# Patient Record
Sex: Male | Born: 1940 | Race: White | Hispanic: No | Smoking: Current some day smoker
Health system: Southern US, Community
[De-identification: ages and names within clinical notes are randomized; demographics above are authoritative.]

## PROBLEM LIST (undated history)

## (undated) DIAGNOSIS — I10 Essential (primary) hypertension: Secondary | ICD-10-CM

## (undated) DIAGNOSIS — M545 Low back pain, unspecified: Secondary | ICD-10-CM

## (undated) DIAGNOSIS — J449 Chronic obstructive pulmonary disease, unspecified: Secondary | ICD-10-CM

## (undated) DIAGNOSIS — R0789 Other chest pain: Secondary | ICD-10-CM

## (undated) DIAGNOSIS — M109 Gout, unspecified: Secondary | ICD-10-CM

## (undated) DIAGNOSIS — E7800 Pure hypercholesterolemia, unspecified: Secondary | ICD-10-CM

## (undated) DIAGNOSIS — N200 Calculus of kidney: Secondary | ICD-10-CM

## (undated) DIAGNOSIS — E785 Hyperlipidemia, unspecified: Secondary | ICD-10-CM

## (undated) DIAGNOSIS — R200 Anesthesia of skin: Secondary | ICD-10-CM

## (undated) DIAGNOSIS — R42 Dizziness and giddiness: Secondary | ICD-10-CM

## (undated) DIAGNOSIS — N400 Enlarged prostate without lower urinary tract symptoms: Secondary | ICD-10-CM

## (undated) DIAGNOSIS — L989 Disorder of the skin and subcutaneous tissue, unspecified: Secondary | ICD-10-CM

## (undated) HISTORY — DX: Low back pain, unspecified: M54.50

## (undated) HISTORY — DX: Calculus of kidney: N20.0

## (undated) HISTORY — PX: TRANSURETHRAL RESECTION OF PROSTATE: SHX73

## (undated) HISTORY — DX: Benign prostatic hyperplasia without lower urinary tract symptoms: N40.0

## (undated) HISTORY — DX: Pure hypercholesterolemia, unspecified: E78.00

## (undated) HISTORY — DX: Chronic obstructive pulmonary disease, unspecified: J44.9

## (undated) HISTORY — PX: TONSILECTOMY, ADENOIDECTOMY, BILATERAL MYRINGOTOMY AND TUBES: SHX2538

## (undated) HISTORY — DX: Anesthesia of skin: R20.0

## (undated) HISTORY — DX: Disorder of the skin and subcutaneous tissue, unspecified: L98.9

## (undated) HISTORY — DX: Essential (primary) hypertension: I10

## (undated) HISTORY — DX: Hyperlipidemia, unspecified: E78.5

## (undated) HISTORY — DX: Dizziness and giddiness: R42

## (undated) HISTORY — DX: Other chest pain: R07.89

---
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
## 2000-12-22 ENCOUNTER — Ambulatory Visit (HOSPITAL_COMMUNITY): Admission: RE | Admit: 2000-12-22 | Discharge: 2000-12-22 | Payer: Self-pay | Admitting: Cardiology

## 2000-12-22 ENCOUNTER — Encounter: Payer: Self-pay | Admitting: Cardiology

## 2005-02-21 ENCOUNTER — Emergency Department (HOSPITAL_COMMUNITY): Admission: EM | Admit: 2005-02-21 | Discharge: 2005-02-21 | Payer: Self-pay | Admitting: Family Medicine

## 2005-05-20 ENCOUNTER — Encounter (INDEPENDENT_AMBULATORY_CARE_PROVIDER_SITE_OTHER): Payer: Self-pay | Admitting: Specialist

## 2005-05-21 ENCOUNTER — Inpatient Hospital Stay (HOSPITAL_COMMUNITY): Admission: RE | Admit: 2005-05-21 | Discharge: 2005-05-22 | Payer: Self-pay | Admitting: Urology

## 2005-10-05 ENCOUNTER — Ambulatory Visit (HOSPITAL_COMMUNITY): Admission: RE | Admit: 2005-10-05 | Discharge: 2005-10-05 | Payer: Self-pay | Admitting: Pulmonary Disease

## 2008-09-03 ENCOUNTER — Ambulatory Visit: Payer: Self-pay | Admitting: Internal Medicine

## 2008-09-03 ENCOUNTER — Ambulatory Visit (HOSPITAL_COMMUNITY): Admission: RE | Admit: 2008-09-03 | Discharge: 2008-09-03 | Payer: Self-pay | Admitting: Internal Medicine

## 2008-09-03 ENCOUNTER — Encounter: Payer: Self-pay | Admitting: Internal Medicine

## 2008-09-10 ENCOUNTER — Encounter (INDEPENDENT_AMBULATORY_CARE_PROVIDER_SITE_OTHER): Payer: Self-pay

## 2008-09-11 ENCOUNTER — Encounter: Payer: Self-pay | Admitting: Internal Medicine

## 2010-11-24 NOTE — Op Note (Signed)
NAME:  Samuel Pope, Samuel Pope                 ACCOUNT NO.:  [id number]   MEDICAL RECORD NO.:  [id number]          PATIENT TYPE:  AMB   LOCATION:  DAY                           FACILITY:  [facility]   PHYSICIAN:  R. Roetta Sessions, M.D. DATE OF BIRTH:  20-Jan-1941   DATE OF PROCEDURE:  09/03/2008  DATE OF DISCHARGE:                               OPERATIVE REPORT   PROCEDURE:  Colonoscopy with biopsy and snare polypectomy.   INDICATIONS FOR PROCEDURE:  A 70 year old gentleman here for colorectal  cancer screening.  He had a colonoscopy 10 years ago which was  reportedly negative.  There was no family history of colon polyps or  colon cancer.  Again, he has no lower GI tract symptoms.  Colonoscopy is  now being done as a screening maneuver.  Risks, benefits, alternatives  and limitations have been discussed, he is agreeable.   PROCEDURE NOTE:  The O2 saturation, blood pressure, pulse and  respirations were monitored throughout the entire procedure.   CONSCIOUS SEDATION:  1. Versed 6 mg IV.  2. Demerol 75 mg IV in divided doses.  3. Cetacaine spray for topical oropharyngeal anesthesia.   INSTRUMENT:  Pentax video chip system.   FINDINGS:  Digital rectal exam revealed no abnormalities.   ENDOSCOPIC FINDINGS:  The prep was adequate.  Colon:  Colonic mucosa was  surveyed from the rectosigmoid junction through the left transverse,  right colon to the appendiceal orifice, ileocecal valve and cecum.  These structures were well seen and photographed for the record.  From  this level, the scope was slowly and cautiously withdrawn.  All  previously mentioned mucosal surfaces were again seen.  The patient had  a diminutive polyp at the base of the cecum which was cold  biopsied/removed.  The patient also had left-sided diverticula.  At 35  cm from the anal verge, the patient had an angry pedunculated 1-cm polyp  on a long stalk, please see photos.  This was resected cleanly with one  pass of hot snare  cautery, the polyp was recovered.  The remainder of  colonic mucosa appeared unremarkable.  The scope was pulled down in the  rectum.  A thorough examination of the rectal mucosa, including  retroflexion of the anal verge, demonstrated no abnormalities.  The  patient tolerated the procedure well, was reactive to endoscopy.   IMPRESSION:  1. Normal rectum.  2  Left-sided diverticula sigmoid polyp (35 cm), status post hot snare  polypectomy; diminutive cecal polyp, status post cold biopsy removal.  The remainder of colonic mucosa appeared unremarkable.   RECOMMENDATIONS:  1. Diverticulosis and polyp literature provided to Mr. Mcbreen.  2. No aspirin or arthritis medications for the next 5 days.  3. Follow-up on path.  4. Further recommendations to follow.      Jonathon Bellows, M.D.  Electronically Signed     RMR/MEDQ  D:  09/03/2008  T:  09/03/2008  Job:  [id number]   cc:   Ramon Dredge L. Juanetta Gosling, M.D.  Fax: [phone]

## 2010-11-27 NOTE — Cardiovascular Report (Signed)
[facility]  Patient:    Samuel Pope, Samuel Pope                        MRN: [id number] Proc. Date: 12/22/00 Adm. Date:  [id number] Attending:  Ronaldo Miyamoto CC:         Thomas C. Daleen Squibb, M.D. [facility]  Kari Baars, M.D.  Cardiac Catheterization Lab   Cardiac Catheterization  INDICATIONS:  Mr. Grahn is a 70 year old who has had some atypical chest pain. He had a modestly abnormal exercise stress test and was seen by Dr. Daleen Squibb and referred for cardiac catheterization.  PROCEDURE: 1. Left heart catheterization. 2. Selective coronary arteriography. 3. Selective left ventriculography.  DESCRIPTION OF PROCEDURE:  The procedure was performed from the right femoral artery using 6-French catheters.  He tolerated the procedure without complication.  HEMODYNAMIC DATA: 1. Central aortic pressure 130/74, mean 97. 2. Left ventricular pressure 119/6/13. 3. No gradient on pullback across the aortic valve.  ANGIOGRAPHIC DATA:  The patient was given intracoronary nitroglycerin. 1. The left main coronary artery was free of critical disease. 2. The left anterior descending artery demonstrates some mild focal narrowing    of about 20-30% in the proximal vessel prior to the bifurcation of the    diagonal and the LAD.  The LAD has mild minimal luminal irregularities    noted in the distal vessel but no high grade areas of stenosis. 3. There is a ramus intermedius vessel that has some mild irregularity at the    ostium.  This does not appear to be high grade, however. 4. The AV circumflex provides two marginal branches and is free of critical    disease. 5. The right coronary artery is a large dominant vessel.  No high grade areas    of stenosis were noted.  It provided a posterior descending and    posterolateral branch. 6. Ventriculography was performed in the RAO projection.  No segmental    abnormality of contraction were identified.  Ejection fraction was  61.5%.  CONCLUSIONS: 1. Normal left ventricular function. 2. No critical coronary artery narrowing.  DISPOSITION: 1. Follow up with Dr. Daleen Squibb and Dr. Juanetta Gosling. 2. Strongly advise that the patient discontinue smoking.  ADDENDUM:  There may be perhaps up to 30% narrowing involving the first diagonal branch proximally.  This is difficult to grade but does not appear to be high grade. DD:  12/22/00 TD:  12/22/00 Job: [id number]

## 2010-11-27 NOTE — Op Note (Signed)
NAME:  Samuel Pope, Samuel Pope                 ACCOUNT NO.:  [id number]   MEDICAL RECORD NO.:  [id number]          PATIENT TYPE:  AMB   LOCATION:  DAY                          FACILITY:  [facility]   PHYSICIAN:  Excell Seltzer. Annabell Howells, M.D.    DATE OF BIRTH:  04-Feb-1941   DATE OF PROCEDURE:  05/20/2005  DATE OF DISCHARGE:                                 OPERATIVE REPORT   PROCEDURE:  Cystolithalopaxy of a large bladder stone and TURP.   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   POSTOPERATIVE DIAGNOSIS:  Large bladder stone and benign prostatic  hypertrophy with outlet obstruction.   SURGEON:  Dr. Bjorn Pippin   ANESTHESIA:  General.   SPECIMEN:  Prostate chips and stone fragments.   DRAIN:  22-French three-way Foley catheter.   BLOOD LOSS:  Approximately 1000 mL.   COMPLICATIONS:  None.   INDICATIONS:  Samuel Pope is a 70 year old white male with bladder outlet  obstruction and a large bladder stone, who presents for treatment.   FINDINGS AT PROCEDURE:  He was given p.o. Cipro.  He was taken to the  operating room where a general anesthetic was induced.  He was placed in  lithotomy position.  His perineum and genitalia were prepped with Betadine  solution.  He was draped in the usual sterile fashion.  His urethra was  calibrated to 32-French with Sissy Hoff sounds, and a 28-French continuous  flow resectoscope sheath was inserted without difficulty.  This was  initially fitted with an extension bridge with a 12-degree lens on a single  bridge.  Inspection revealed bilobar hyperplasia with obstruction and an  approximately 3 cm bladder stone at the base of the bladder.  The ureteral  orifices were unremarkable.  There was a large diverticulum on the right  posterior wall of the bladder.  No other abnormalities were noted other than  some mild mucosal irritation from the stone.   The cystoscope was then fitted with a 1000 micron laser fiber  which was  secured to the lens with a Steri-Strip to provide stability.  The laser was  initially set on 0.5 watts, and treatment was initiated.  We eventually had  to increase the power to 0.8 and increase the rate as well, but the stone  initially was fragmented into sufficiently small pieces that it could be  removed.  Approximately 30 minutes laser treatment was required.   Once the stone had been completely fragmented, it was evacuated with a  Careers information officer.   The cystoscope was then removed, and an Iglesias resectoscope with a 12-  degrees lens and 26 loop was then placed.  A resection of prostate was then  initiated.   The bladder neck fibers were resected from 5 to 7 o'clock, and the floor of  the prostate was resected out to alongside the verumontanum.  The right lobe  of the prostate was taken down from bladder neck to apex, out to the  capsular fibers.  Hemostasis was achieved as the resection progressed.   The left lobe was  then taken down in a similar fashion.  Once adequate  resection of the lateral lobes was felt to have been performed, the bladder  was evacuated free of chips.  I then resected some residual apical tissue on  both sides, some additional material from the floor, and alongside the veru  and the anterior aspect of the prostatic urethra was section as well.  This  tissue was then removed, and hemostasis was secured.  Final inspection of  the bladder revealed no retained prostate chips.  There was some stone dust  that was adherent to the mucosa that was not readily removed, but no big  chunks were noted to remain.  The ureteral orifices were unremarkable.  No  fragments of stone or prostate tissue were noted in the diverticulum.  Upon  removal of the scope, an excellent string was noted.   A 22-French three-way Foley catheter was then inserted.  The bladder was  irrigated with an __________ syringe until clear.  Once cleared, the patient  was  placed on continuous irrigation of glycine.  The Foley balloon was  filled with 30 mL of sterile fluid.   At this point, the patient was taken down from the lithotomy position.  His  anesthetic was reversed.  He was moved to the recovery room in stable  condition, and there were no complications.      Excell Seltzer. Annabell Howells, M.D.  Electronically Signed     JJW/MEDQ  D:  05/20/2005  T:  05/20/2005  Job:  [id number]   cc:   Ramon Dredge L. Juanetta Gosling, M.D.  Fax: [phone]

## 2011-01-25 ENCOUNTER — Other Ambulatory Visit (HOSPITAL_COMMUNITY): Payer: Self-pay | Admitting: Pulmonary Disease

## 2011-01-25 ENCOUNTER — Ambulatory Visit (HOSPITAL_COMMUNITY)
Admission: RE | Admit: 2011-01-25 | Discharge: 2011-01-25 | Disposition: A | Discharge status: Home / Self Care | Payer: Medicare HMO | Source: Ambulatory Visit | Attending: Pulmonary Disease | Admitting: Pulmonary Disease

## 2011-01-25 DIAGNOSIS — R079 Chest pain, unspecified: Secondary | ICD-10-CM | POA: Insufficient documentation

## 2011-01-25 DIAGNOSIS — R52 Pain, unspecified: Secondary | ICD-10-CM

## 2011-04-06 ENCOUNTER — Encounter: Payer: Self-pay | Admitting: Pulmonary Disease

## 2015-07-02 DIAGNOSIS — J449 Chronic obstructive pulmonary disease, unspecified: Secondary | ICD-10-CM | POA: Diagnosis not present

## 2015-07-02 DIAGNOSIS — M545 Low back pain: Secondary | ICD-10-CM | POA: Diagnosis not present

## 2015-07-08 DIAGNOSIS — Z0000 Encounter for general adult medical examination without abnormal findings: Secondary | ICD-10-CM | POA: Diagnosis not present

## 2015-07-08 DIAGNOSIS — J449 Chronic obstructive pulmonary disease, unspecified: Secondary | ICD-10-CM | POA: Diagnosis not present

## 2015-07-08 DIAGNOSIS — E785 Hyperlipidemia, unspecified: Secondary | ICD-10-CM | POA: Diagnosis not present

## 2015-07-08 DIAGNOSIS — M545 Low back pain: Secondary | ICD-10-CM | POA: Diagnosis not present

## 2015-07-08 DIAGNOSIS — Z125 Encounter for screening for malignant neoplasm of prostate: Secondary | ICD-10-CM | POA: Diagnosis not present

## 2015-09-30 ENCOUNTER — Ambulatory Visit (INDEPENDENT_AMBULATORY_CARE_PROVIDER_SITE_OTHER): Payer: Medicare PPO | Admitting: Nurse Practitioner

## 2015-09-30 ENCOUNTER — Encounter: Payer: Self-pay | Admitting: Nurse Practitioner

## 2015-09-30 ENCOUNTER — Other Ambulatory Visit: Payer: Self-pay

## 2015-09-30 VITALS — BP 160/110 | HR 68 | Temp 98.0°F | Ht 70.0 in | Wt 195.2 lb

## 2015-09-30 DIAGNOSIS — Z86010 Personal history of colonic polyps: Secondary | ICD-10-CM

## 2015-09-30 MED ORDER — PEG 3350-KCL-NA BICARB-NACL 420 G PO SOLR: 4000.0000 mL | Freq: Once | ORAL | Status: DC

## 2015-09-30 NOTE — Assessment & Plan Note (Signed)
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 He is slightly overdue for his repeat exam. Generally asymptomatic from a GI standpoint. His blood pressure is a little elevated today and he admits he only takes his blood pressure medicine once a day rather than twice a day due to side effects. Encouraged him to contact his primary care to discuss in order to titrate meds and get blood pressure under better control. He states he will do this. At this point we'll proceed with recommended surveillance colonoscopy. Return for follow-up as needed for symptoms.  Proceed with TCS with Dr. Gala Romney in near future: the risks, benefits, and alternatives have been discussed with the patient in detail. The patient states understanding and desires to proceed.  The patient is not on any anticoagulants, anxiolytics, chronic pain medications, or antidepressants. Limited alcohol intake, denies drug use. Conscious sedation should be adequate for his procedure as it was last time.

## 2015-09-30 NOTE — Patient Instructions (Signed)
1. We will schedule your procedure for you. 2. Return for follow-up as needed for any stomach or colon problems, or as recommended after your procedure.

## 2015-09-30 NOTE — Progress Notes (Signed)
Primary Care Physician:  Alonza Bogus, MD Primary Gastroenterologist:  Dr. Gala Romney  Chief Complaint  Patient presents with  . Colonoscopy    HPI:   Samuel Pope is a 75 y.o. male who presents To schedule 5 year colonoscopy. Last colonoscopy completed to 09/03/2008 which was a routine 10 year exam. Findings included normal rectum, left-sided diverticula, sigmoid polyp, diminutive cecal polyp. 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 Recommend repeat colonoscopy in 5 years.  Today he states he's feeling well generally. Denies abdominal pain, N/V, melena. Has occasional/rare toilet tissue hematochezia which he attributes to hemorrhoids with last episode 1 month ago. Denies fever, chills, changes in bowel habits, unintentional weight loss. Denies chest pain, dyspnea, dizziness, lightheadedness, syncope, near syncope. Denies any other upper or lower GI symptoms.  States he is only taking his BP medication once a day rather than twice a day as prescribed due to side effects he doesn't like.   Past Medical History  Diagnosis Date  . Hypertension   . Hypercholesterolemia   . Prostate enlargement     Past Surgical History  Procedure Laterality Date  . Transurethral resection of prostate    . Tonsilectomy, adenoidectomy, bilateral myringotomy and tubes      Current Outpatient Prescriptions  Medication Sig Dispense Refill  . metoprolol (LOPRESSOR) 50 MG tablet Take 25 mg by mouth 2 (two) times daily.     . simvastatin (ZOCOR) 20 MG tablet      No current facility-administered medications for this visit.    Allergies as of 09/30/2015  . (No Known Allergies)    Family History  Problem Relation Age of Onset  . Colon cancer Paternal Uncle   . Colon polyps Sister     "a lot of colon polyps"    Social History   Social History  . Marital Status: Married    Spouse Name: N/A  . Number of Children:  N/A  . Years of Education: N/A   Occupational History  . Not on file.   Social History Main Topics  . Smoking status: Current Some Day Smoker -- 1.00 packs/day    Types: Cigarettes  . Smokeless tobacco: Never Used  . Alcohol Use: 0.0 oz/week    0 Standard drinks or equivalent per week     Comment: 1 beer a week  . Drug Use: No  . Sexual Activity: Not on file   Other Topics Concern  . Not on file   Social History Narrative  . No narrative on file    Review of Systems: General: Negative for anorexia, weight loss, fever, chills, fatigue, weakness. Eyes: Negative for vision changes.  ENT: Negative for hoarseness, difficulty swallowing. CV: Negative for chest pain, angina, palpitations, peripheral edema.  Respiratory: Negative for dyspnea at rest, cough, sputum, wheezing.  GI: See history of present illness. Derm: Negative for rash or itching.  Endo: Negative for unusual weight change.  Heme: Negative for bruising or bleeding. Allergy: Negative for rash or hives.    Physical Exam: BP 160/110 mmHg  Pulse 68  Temp(Src) 98 F (36.7 C) (Oral)  Ht 5\' 10"  (1.778 m)  Wt 195 lb 3.2 oz (88.542 kg)  BMI 28.01 kg/m2 General:   Alert and oriented. Pleasant and cooperative. Well-nourished and well-developed.  Head:  Normocephalic and atraumatic. Eyes:  Without icterus, sclera clear and conjunctiva pink.  Ears:  Normal auditory acuity. Cardiovascular:  S1, S2 present without murmurs appreciated.  Extremities without clubbing or edema. Respiratory:  Clear to auscultation bilaterally. No wheezes, rales, or rhonchi. No distress.  Gastrointestinal:  +BS, soft, non-tender and non-distended. No HSM noted. No guarding or rebound. No masses appreciated.  Rectal:  Deferred  Musculoskalatal:  Symmetrical without gross deformities. Skin:  Intact without significant lesions or rashes. Neurologic:  Alert and oriented x4;  grossly normal neurologically. Psych:  Alert and cooperative. Normal  mood and affect. Heme/Lymph/Immune: No excessive bruising noted.    09/30/2015 9:05 AM   Disclaimer: This note was dictated with voice recognition software. Similar sounding words can inadvertently be transcribed and may not be corrected upon review.

## 2015-10-01 NOTE — Progress Notes (Signed)
CC'D TO PCP °

## 2015-10-23 ENCOUNTER — Encounter (HOSPITAL_COMMUNITY): Payer: Self-pay | Admitting: *Deleted

## 2015-10-23 ENCOUNTER — Encounter (HOSPITAL_COMMUNITY)
Admission: RE | Disposition: A | Discharge status: Home / Self Care | Payer: Self-pay | Source: Ambulatory Visit | Attending: Internal Medicine

## 2015-10-23 ENCOUNTER — Ambulatory Visit (HOSPITAL_COMMUNITY)
Admission: RE | Admit: 2015-10-23 | Discharge: 2015-10-23 | Disposition: A | Discharge status: Home / Self Care | Payer: Medicare PPO | Source: Ambulatory Visit | Attending: Internal Medicine | Admitting: Internal Medicine

## 2015-10-23 DIAGNOSIS — E7800 Pure hypercholesterolemia, unspecified: Secondary | ICD-10-CM | POA: Diagnosis not present

## 2015-10-23 DIAGNOSIS — I10 Essential (primary) hypertension: Secondary | ICD-10-CM | POA: Insufficient documentation

## 2015-10-23 DIAGNOSIS — Z1211 Encounter for screening for malignant neoplasm of colon: Secondary | ICD-10-CM | POA: Insufficient documentation

## 2015-10-23 DIAGNOSIS — Z79899 Other long term (current) drug therapy: Secondary | ICD-10-CM | POA: Diagnosis not present

## 2015-10-23 DIAGNOSIS — K5730 Diverticulosis of large intestine without perforation or abscess without bleeding: Secondary | ICD-10-CM | POA: Diagnosis not present

## 2015-10-23 DIAGNOSIS — F17210 Nicotine dependence, cigarettes, uncomplicated: Secondary | ICD-10-CM | POA: Insufficient documentation

## 2015-10-23 DIAGNOSIS — Z86010 Personal history of colonic polyps: Secondary | ICD-10-CM

## 2015-10-23 DIAGNOSIS — D120 Benign neoplasm of cecum: Secondary | ICD-10-CM | POA: Diagnosis not present

## 2015-10-23 DIAGNOSIS — N400 Enlarged prostate without lower urinary tract symptoms: Secondary | ICD-10-CM | POA: Insufficient documentation

## 2015-10-23 HISTORY — PX: COLONOSCOPY: SHX5424

## 2015-10-23 SURGERY — COLONOSCOPY
Anesthesia: Moderate Sedation

## 2015-10-23 MED ORDER — MIDAZOLAM HCL 5 MG/5ML IJ SOLN
INTRAMUSCULAR | Status: AC
  Filled 2015-10-23: qty 10

## 2015-10-23 MED ORDER — ONDANSETRON HCL 4 MG/2ML IJ SOLN
INTRAMUSCULAR | Status: DC | PRN
  Administered 2015-10-23: 4 mg via INTRAVENOUS

## 2015-10-23 MED ORDER — STERILE WATER FOR IRRIGATION IR SOLN
Status: DC | PRN
  Administered 2015-10-23: 09:00:00

## 2015-10-23 MED ORDER — MEPERIDINE HCL 100 MG/ML IJ SOLN
INTRAMUSCULAR | Status: DC | PRN
  Administered 2015-10-23 (×2): 25 mg via INTRAVENOUS

## 2015-10-23 MED ORDER — ONDANSETRON HCL 4 MG/2ML IJ SOLN
INTRAMUSCULAR | Status: AC
  Filled 2015-10-23: qty 2

## 2015-10-23 MED ORDER — SODIUM CHLORIDE 0.9 % IV SOLN
INTRAVENOUS | Status: DC
  Administered 2015-10-23: 09:00:00 via INTRAVENOUS

## 2015-10-23 MED ORDER — MIDAZOLAM HCL 5 MG/5ML IJ SOLN
INTRAMUSCULAR | Status: DC | PRN
  Administered 2015-10-23: 2 mg via INTRAVENOUS
  Administered 2015-10-23 (×2): 1 mg via INTRAVENOUS

## 2015-10-23 MED ORDER — MEPERIDINE HCL 100 MG/ML IJ SOLN
INTRAMUSCULAR | Status: AC
  Filled 2015-10-23: qty 2

## 2015-10-23 NOTE — Discharge Instructions (Signed)
°Colonoscopy °Discharge Instructions ° °Read the instructions outlined below and refer to this sheet in the next few weeks. These discharge instructions provide you with general information on caring for yourself after you leave the hospital. Your doctor may also give you specific instructions. While your treatment has been planned according to the most current medical practices available, unavoidable complications occasionally occur. If you have any problems or questions after discharge, call Dr. Rourk at [phone]. °ACTIVITY °· You may resume your regular activity, but move at a slower pace for the next 24 hours.  °· Take frequent rest periods for the next 24 hours.  °· Walking will help get rid of the air and reduce the bloated feeling in your belly (abdomen).  °· No driving for 24 hours (because of the medicine (anesthesia) used during the test).   °· Do not sign any important legal documents or operate any machinery for 24 hours (because of the anesthesia used during the test).  °NUTRITION °· Drink plenty of fluids.  °· You may resume your normal diet as instructed by your doctor.  °· Begin with a light meal and progress to your normal diet. Heavy or fried foods are harder to digest and may make you feel sick to your stomach (nauseated).  °· Avoid alcoholic beverages for 24 hours or as instructed.  °MEDICATIONS °· You may resume your normal medications unless your doctor tells you otherwise.  °WHAT YOU CAN EXPECT TODAY °· Some feelings of bloating in the abdomen.  °· Passage of more gas than usual.  °· Spotting of blood in your stool or on the toilet paper.  °IF YOU HAD POLYPS REMOVED DURING THE COLONOSCOPY: °· No aspirin products for 7 days or as instructed.  °· No alcohol for 7 days or as instructed.  °· Eat a soft diet for the next 24 hours.  °FINDING OUT THE RESULTS OF YOUR TEST °Not all test results are available during your visit. If your test results are not back during the visit, make an appointment  with your caregiver to find out the results. Do not assume everything is normal if you have not heard from your caregiver or the medical facility. It is important for you to follow up on all of your test results.  °SEEK IMMEDIATE MEDICAL ATTENTION IF: °· You have more than a spotting of blood in your stool.  °· Your belly is swollen (abdominal distention).  °· You are nauseated or vomiting.  °· You have a temperature over 101.  °· You have abdominal pain or discomfort that is severe or gets worse throughout the day.  ° ° ° °Colon polyp and diverticulosis information provided ° °Further recommendations to follow pending review of pathology report ° ° ° ° ° °                                                                                                                     Colon Polyps °Polyps are lumps of extra tissue growing inside the   body. Polyps can grow in the large intestine (colon). Most colon polyps are noncancerous (benign). However, some colon polyps can become cancerous over time. Polyps that are larger than a pea may be harmful. To be safe, caregivers remove and test all polyps. °CAUSES  °Polyps form when mutations in the genes cause your cells to grow and divide even though no more tissue is needed. °RISK FACTORS °There are a number of risk factors that can increase your chances of getting colon polyps. They include: °· Being older than 50 years. °· Family history of colon polyps or colon cancer. °· Long-term colon diseases, such as colitis or Crohn disease. °· Being overweight. °· Smoking. °· Being inactive. °· Drinking too much alcohol. °SYMPTOMS  °Most small polyps do not cause symptoms. If symptoms are present, they may include: °· Blood in the stool. The stool may look dark red or black. °· Constipation or diarrhea that lasts longer than 1 week. °DIAGNOSIS °People often do not know they have polyps until their caregiver finds them during a regular checkup. Your caregiver can use 4 tests to check for  polyps: °· Digital rectal exam. The caregiver wears gloves and feels inside the rectum. This test would find polyps only in the rectum. °· Barium enema. The caregiver puts a liquid called barium into your rectum before taking X-rays of your colon. Barium makes your colon look white. Polyps are dark, so they are easy to see in the X-ray pictures. °· Sigmoidoscopy. A thin, flexible tube (sigmoidoscope) is placed into your rectum. The sigmoidoscope has a light and tiny camera in it. The caregiver uses the sigmoidoscope to look at the last third of your colon. °· Colonoscopy. This test is like sigmoidoscopy, but the caregiver looks at the entire colon. This is the most common method for finding and removing polyps. °TREATMENT  °Any polyps will be removed during a sigmoidoscopy or colonoscopy. The polyps are then tested for cancer. °PREVENTION  °To help lower your risk of getting more colon polyps: °· Eat plenty of fruits and vegetables. Avoid eating fatty foods. °· Do not smoke. °· Avoid drinking alcohol. °· Exercise every day. °· Lose weight if recommended by your caregiver. °· Eat plenty of calcium and folate. Foods that are rich in calcium include milk, cheese, and broccoli. Foods that are rich in folate include chickpeas, kidney beans, and spinach. °HOME CARE INSTRUCTIONS °Keep all follow-up appointments as directed by your caregiver. You may need periodic exams to check for polyps. °SEEK MEDICAL CARE IF: °You notice bleeding during a bowel movement. °  °This information is not intended to replace advice given to you by your health care provider. Make sure you discuss any questions you have with your health care provider. °  °Document Released: 03/24/2004 Document Revised: 07/19/2014 Document Reviewed: 09/07/2011 °Elsevier Interactive Patient Education ©2016 [employer] ° ° ° ° ° ° ° °Diverticulosis °Diverticulosis is the condition that develops when small pouches (diverticula) form in the wall of your colon. Your  colon, or large intestine, is where water is absorbed and stool is formed. The pouches form when the inside layer of your colon pushes through weak spots in the outer layers of your colon. °CAUSES  °No one knows exactly what causes diverticulosis. °RISK FACTORS °· Being older than 50. Your risk for this condition increases with age. Diverticulosis is rare in people younger than 40 years. By age 80, almost everyone has it. °· Eating a low-fiber diet. °· Being frequently constipated. °· Being overweight. °·   Not getting enough exercise. °· Smoking. °· Taking over-the-counter pain medicines, like aspirin and ibuprofen. °SYMPTOMS  °Most people with diverticulosis do not have symptoms. °DIAGNOSIS  °Because diverticulosis often has no symptoms, health care providers often discover the condition during an exam for other colon problems. In many cases, a health care provider will diagnose diverticulosis while using a flexible scope to examine the colon (colonoscopy). °TREATMENT  °If you have never developed an infection related to diverticulosis, you may not need treatment. If you have had an infection before, treatment may include: °· Eating more fruits, vegetables, and grains. °· Taking a fiber supplement. °· Taking a live bacteria supplement (probiotic). °· Taking medicine to relax your colon. °HOME CARE INSTRUCTIONS  °· Drink at least 6-8 glasses of water each day to prevent constipation. °· Try not to strain when you have a bowel movement. °· Keep all follow-up appointments. °If you have had an infection before:  °· Increase the fiber in your diet as directed by your health care provider or dietitian. °· Take a dietary fiber supplement if your health care provider approves. °· Only take medicines as directed by your health care provider. °SEEK MEDICAL CARE IF:  °· You have abdominal pain. °· You have bloating. °· You have cramps. °· You have not gone to the bathroom in 3 days. °SEEK IMMEDIATE MEDICAL CARE IF:  °· Your  pain gets worse. °· Your bloating becomes very bad. °· You have a fever or chills, and your symptoms suddenly get worse. °· You begin vomiting. °· You have bowel movements that are bloody or black. °MAKE SURE YOU: °· Understand these instructions. °· Will watch your condition. °· Will get help right away if you are not doing well or get worse. °  °This information is not intended to replace advice given to you by your health care provider. Make sure you discuss any questions you have with your health care provider. °  °Document Released: 03/25/2004 Document Revised: 07/03/2013 Document Reviewed: 05/23/2013 °Elsevier Interactive Patient Education ©2016 [employer] ° ° °

## 2015-10-23 NOTE — H&P (View-Only) (Signed)
Primary Care Physician:  Alonza Bogus, MD Primary Gastroenterologist:  Dr. Gala Romney  Chief Complaint  Patient presents with  . Colonoscopy    HPI:   Samuel Pope is a 75 y.o. male who presents To schedule 5 year colonoscopy. Last colonoscopy completed to 09/03/2008 which was a routine 10 year exam. Findings included normal rectum, left-sided diverticula, sigmoid polyp, diminutive cecal polyp. 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 Recommend repeat colonoscopy in 5 years.  Today he states he's feeling well generally. Denies abdominal pain, N/V, melena. Has occasional/rare toilet tissue hematochezia which he attributes to hemorrhoids with last episode 1 month ago. Denies fever, chills, changes in bowel habits, unintentional weight loss. Denies chest pain, dyspnea, dizziness, lightheadedness, syncope, near syncope. Denies any other upper or lower GI symptoms.  States he is only taking his BP medication once a day rather than twice a day as prescribed due to side effects he doesn't like.   Past Medical History  Diagnosis Date  . Hypertension   . Hypercholesterolemia   . Prostate enlargement     Past Surgical History  Procedure Laterality Date  . Transurethral resection of prostate    . Tonsilectomy, adenoidectomy, bilateral myringotomy and tubes      Current Outpatient Prescriptions  Medication Sig Dispense Refill  . metoprolol (LOPRESSOR) 50 MG tablet Take 25 mg by mouth 2 (two) times daily.     . simvastatin (ZOCOR) 20 MG tablet      No current facility-administered medications for this visit.    Allergies as of 09/30/2015  . (No Known Allergies)    Family History  Problem Relation Age of Onset  . Colon cancer Paternal Uncle   . Colon polyps Sister     "a lot of colon polyps"    Social History   Social History  . Marital Status: Married    Spouse Name: N/A  . Number of Children:  N/A  . Years of Education: N/A   Occupational History  . Not on file.   Social History Main Topics  . Smoking status: Current Some Day Smoker -- 1.00 packs/day    Types: Cigarettes  . Smokeless tobacco: Never Used  . Alcohol Use: 0.0 oz/week    0 Standard drinks or equivalent per week     Comment: 1 beer a week  . Drug Use: No  . Sexual Activity: Not on file   Other Topics Concern  . Not on file   Social History Narrative  . No narrative on file    Review of Systems: General: Negative for anorexia, weight loss, fever, chills, fatigue, weakness. Eyes: Negative for vision changes.  ENT: Negative for hoarseness, difficulty swallowing. CV: Negative for chest pain, angina, palpitations, peripheral edema.  Respiratory: Negative for dyspnea at rest, cough, sputum, wheezing.  GI: See history of present illness. Derm: Negative for rash or itching.  Endo: Negative for unusual weight change.  Heme: Negative for bruising or bleeding. Allergy: Negative for rash or hives.    Physical Exam: BP 160/110 mmHg  Pulse 68  Temp(Src) 98 F (36.7 C) (Oral)  Ht 5\' 10"  (1.778 m)  Wt 195 lb 3.2 oz (88.542 kg)  BMI 28.01 kg/m2 General:   Alert and oriented. Pleasant and cooperative. Well-nourished and well-developed.  Head:  Normocephalic and atraumatic. Eyes:  Without icterus, sclera clear and conjunctiva pink.  Ears:  Normal auditory acuity. Cardiovascular:  S1, S2 present without murmurs appreciated.  Extremities without clubbing or edema. Respiratory:  Clear to auscultation bilaterally. No wheezes, rales, or rhonchi. No distress.  Gastrointestinal:  +BS, soft, non-tender and non-distended. No HSM noted. No guarding or rebound. No masses appreciated.  Rectal:  Deferred  Musculoskalatal:  Symmetrical without gross deformities. Skin:  Intact without significant lesions or rashes. Neurologic:  Alert and oriented x4;  grossly normal neurologically. Psych:  Alert and cooperative. Normal  mood and affect. Heme/Lymph/Immune: No excessive bruising noted.    09/30/2015 9:05 AM   Disclaimer: This note was dictated with voice recognition software. Similar sounding words can inadvertently be transcribed and may not be corrected upon review.

## 2015-10-23 NOTE — Interval H&P Note (Signed)
History and Physical Interval Note:  10/23/2015 9:19 AM  Samuel Pope  has presented today for surgery, with the diagnosis of history of colon polyp  The various methods of treatment have been discussed with the patient and family. After consideration of risks, benefits and other options for treatment, the patient has consented to  Procedure(s) with comments: COLONOSCOPY (N/A) - 0915 as a surgical intervention .  The patient's history has been reviewed, patient examined, no change in status, stable for surgery.  I have reviewed the patient's chart and labs.  Questions were answered to the patient's satisfaction.     Robert Rourk  No change. Surveillance colonoscopy per plan.  The risks, benefits, limitations, alternatives and imponderables have been reviewed with the patient. Questions have been answered. All parties are agreeable.

## 2015-10-23 NOTE — Op Note (Signed)
[facility] Patient Name: Samuel Pope Procedure Date: 10/23/2015 9:21 AM MRN: [id number] Date of Birth: March 16, 1941 Attending MD: Norvel Richards , MD CSN: [id number] Age: 75 Admit Type: Outpatient Procedure:                Colonoscopy Indications:              High risk colon cancer surveillance: Personal                            history of colonic polyps Providers:                Norvel Richards, MD, Gwenlyn Fudge, RN, Georgeann Oppenheim, Technician Referring MD:              Medicines:                Midazolam 4 mg IV, Meperidine 50 mg IV, Ondansetron                            4 mg IV Complications:            No immediate complications. Estimated Blood Loss:     Estimated blood loss was minimal. Procedure:                Pre-Anesthesia Assessment:                           - Prior to the procedure, a History and Physical                            was performed, and patient medications and                            allergies were reviewed. The patient's tolerance of                            previous anesthesia was also reviewed. The risks                            and benefits of the procedure and the sedation                            options and risks were discussed with the patient.                            All questions were answered, and informed consent                            was obtained. Prior Anticoagulants: The patient has                            taken no previous anticoagulant or antiplatelet  agents. ASA Grade Assessment: II - A patient with                            mild systemic disease. After reviewing the risks                            and benefits, the patient was deemed in                            satisfactory condition to undergo the procedure.                           After obtaining informed consent, the colonoscope                            was passed under direct vision.  Throughout the                            procedure, the patient's blood pressure, pulse, and                            oxygen saturations were monitored continuously. The                            [id number] scope was introduced through                            the anus and advanced to the the cecum, identified                            by appendiceal orifice and ileocecal valve. The                            colonoscopy was performed without difficulty. The                            patient tolerated the procedure well. The quality                            of the bowel preparation was adequate. The                            ileocecal valve, appendiceal orifice, and rectum                            were photographed. Scope In: 9:29:50 AM Scope Out: 9:45:01 AM Scope Withdrawal Time: 0 hours 10 minutes 21 seconds  Total Procedure Duration: 0 hours 15 minutes 11 seconds  Findings:      (3) 3 to 9 mm polyp was found in the cecum. The polyp was       semi-pedunculated. The polyp was removed with a cold snare. Resection       and retrieval were complete.      Scattered medium-mouthed diverticula were found in the sigmoid colon.  The perianal and digital rectal examinations were normal.      The exam was otherwise without abnormality on direct and retroflexion       views. Impression:               -(3) 3 to 9 mm polyp in the cecum, removed with a                            cold snare. Resected and retrieved.                           - Diverticulosis in the sigmoid colon.                           - The examination was otherwise normal on direct                            and retroflexion views. Moderate Sedation:      Moderate (conscious) sedation was administered by the endoscopy nurse       and supervised by the endoscopist. The following parameters were       monitored: oxygen saturation, heart rate, blood pressure, respiratory       rate, EKG, adequacy of  pulmonary ventilation, and response to care.       Total physician intraservice time was 21 minutes. Recommendation:           - Patient has a contact number available for                            emergencies. The signs and symptoms of potential                            delayed complications were discussed with the                            patient. Return to normal activities tomorrow.                            Written discharge instructions were provided to the                            patient.                           - Advance diet as tolerated.                           - Continue present medications.                           - Await pathology results.                           - Repeat colonoscopy date to be determined after                            pending pathology results are reviewed for  surveillance based on pathology results.                           - Return to GI office at appointment to be                            scheduled. Procedure Code(s):        --- Professional ---                           [phone], Colonoscopy, flexible; with removal of                            tumor(s), polyp(s), or other lesion(s) by snare                            technique                           99152, Moderate sedation services provided by the                            same physician or other qualified health care                            professional performing the diagnostic or                            therapeutic service that the sedation supports,                            requiring the presence of an independent trained                            observer to assist in the monitoring of the                            patient's level of consciousness and physiological                            status; initial 15 minutes of intraservice time,                            patient age 54 years or older Diagnosis Code(s):        --- Professional  ---                           Z86.010, Personal history of colonic polyps                           D12.0, Benign neoplasm of cecum CPT copyright 2016 American Medical Association. All rights reserved. The codes documented in this report are preliminary and upon coder review may  be revised to meet current compliance requirements. Cristopher Estimable. Khylee Algeo, MD Norvel Richards, MD 10/23/2015 9:58:23 AM This report has been signed electronically. Number of  Addenda: 0

## 2015-10-27 ENCOUNTER — Encounter: Payer: Self-pay | Admitting: Internal Medicine

## 2015-10-28 ENCOUNTER — Encounter (HOSPITAL_COMMUNITY): Payer: Self-pay | Admitting: Internal Medicine

## 2015-12-31 DIAGNOSIS — Z0000 Encounter for general adult medical examination without abnormal findings: Secondary | ICD-10-CM | POA: Diagnosis not present

## 2016-07-01 DIAGNOSIS — L989 Disorder of the skin and subcutaneous tissue, unspecified: Secondary | ICD-10-CM | POA: Diagnosis not present

## 2016-07-01 DIAGNOSIS — I10 Essential (primary) hypertension: Secondary | ICD-10-CM | POA: Diagnosis not present

## 2016-07-01 DIAGNOSIS — M545 Low back pain: Secondary | ICD-10-CM | POA: Diagnosis not present

## 2016-07-01 DIAGNOSIS — J449 Chronic obstructive pulmonary disease, unspecified: Secondary | ICD-10-CM | POA: Diagnosis not present

## 2016-07-22 DIAGNOSIS — C44311 Basal cell carcinoma of skin of nose: Secondary | ICD-10-CM | POA: Diagnosis not present

## 2016-07-22 DIAGNOSIS — D1801 Hemangioma of skin and subcutaneous tissue: Secondary | ICD-10-CM | POA: Diagnosis not present

## 2016-07-22 DIAGNOSIS — D225 Melanocytic nevi of trunk: Secondary | ICD-10-CM | POA: Diagnosis not present

## 2016-07-22 DIAGNOSIS — L821 Other seborrheic keratosis: Secondary | ICD-10-CM | POA: Diagnosis not present

## 2016-08-19 DIAGNOSIS — Z08 Encounter for follow-up examination after completed treatment for malignant neoplasm: Secondary | ICD-10-CM | POA: Diagnosis not present

## 2016-08-19 DIAGNOSIS — Z85828 Personal history of other malignant neoplasm of skin: Secondary | ICD-10-CM | POA: Diagnosis not present

## 2016-08-24 ENCOUNTER — Ambulatory Visit (HOSPITAL_COMMUNITY)
Admission: RE | Admit: 2016-08-24 | Discharge: 2016-08-24 | Disposition: A | Discharge status: Home / Self Care | Payer: Medicare PPO | Source: Ambulatory Visit | Attending: Pulmonary Disease | Admitting: Pulmonary Disease

## 2016-08-24 ENCOUNTER — Other Ambulatory Visit (HOSPITAL_COMMUNITY): Payer: Self-pay | Admitting: Pulmonary Disease

## 2016-08-24 DIAGNOSIS — M25512 Pain in left shoulder: Secondary | ICD-10-CM | POA: Diagnosis not present

## 2016-08-24 DIAGNOSIS — M19012 Primary osteoarthritis, left shoulder: Secondary | ICD-10-CM | POA: Insufficient documentation

## 2016-08-24 DIAGNOSIS — S4992XA Unspecified injury of left shoulder and upper arm, initial encounter: Secondary | ICD-10-CM | POA: Diagnosis not present

## 2016-08-24 DIAGNOSIS — I700 Atherosclerosis of aorta: Secondary | ICD-10-CM | POA: Insufficient documentation

## 2016-08-24 DIAGNOSIS — R52 Pain, unspecified: Secondary | ICD-10-CM

## 2016-10-12 DIAGNOSIS — M25512 Pain in left shoulder: Secondary | ICD-10-CM | POA: Diagnosis not present

## 2016-10-12 DIAGNOSIS — J449 Chronic obstructive pulmonary disease, unspecified: Secondary | ICD-10-CM | POA: Diagnosis not present

## 2016-10-28 DIAGNOSIS — H25013 Cortical age-related cataract, bilateral: Secondary | ICD-10-CM | POA: Diagnosis not present

## 2016-10-28 DIAGNOSIS — H2513 Age-related nuclear cataract, bilateral: Secondary | ICD-10-CM | POA: Diagnosis not present

## 2016-10-28 DIAGNOSIS — H524 Presbyopia: Secondary | ICD-10-CM | POA: Diagnosis not present

## 2016-10-28 DIAGNOSIS — H401131 Primary open-angle glaucoma, bilateral, mild stage: Secondary | ICD-10-CM | POA: Diagnosis not present

## 2016-10-28 DIAGNOSIS — H5213 Myopia, bilateral: Secondary | ICD-10-CM | POA: Diagnosis not present

## 2016-12-30 DIAGNOSIS — F17200 Nicotine dependence, unspecified, uncomplicated: Secondary | ICD-10-CM | POA: Diagnosis not present

## 2016-12-30 DIAGNOSIS — Z0000 Encounter for general adult medical examination without abnormal findings: Secondary | ICD-10-CM | POA: Diagnosis not present

## 2016-12-30 DIAGNOSIS — Z1211 Encounter for screening for malignant neoplasm of colon: Secondary | ICD-10-CM | POA: Diagnosis not present

## 2017-01-24 DIAGNOSIS — H2513 Age-related nuclear cataract, bilateral: Secondary | ICD-10-CM | POA: Diagnosis not present

## 2017-02-02 DIAGNOSIS — J449 Chronic obstructive pulmonary disease, unspecified: Secondary | ICD-10-CM | POA: Diagnosis not present

## 2017-02-02 DIAGNOSIS — I10 Essential (primary) hypertension: Secondary | ICD-10-CM | POA: Diagnosis not present

## 2017-02-12 DIAGNOSIS — F17200 Nicotine dependence, unspecified, uncomplicated: Secondary | ICD-10-CM | POA: Diagnosis not present

## 2017-02-12 DIAGNOSIS — E785 Hyperlipidemia, unspecified: Secondary | ICD-10-CM | POA: Diagnosis not present

## 2017-02-12 DIAGNOSIS — Z6826 Body mass index (BMI) 26.0-26.9, adult: Secondary | ICD-10-CM | POA: Diagnosis not present

## 2017-02-12 DIAGNOSIS — I10 Essential (primary) hypertension: Secondary | ICD-10-CM | POA: Diagnosis not present

## 2017-02-12 DIAGNOSIS — E663 Overweight: Secondary | ICD-10-CM | POA: Diagnosis not present

## 2017-02-12 DIAGNOSIS — H9190 Unspecified hearing loss, unspecified ear: Secondary | ICD-10-CM | POA: Diagnosis not present

## 2017-02-12 DIAGNOSIS — H547 Unspecified visual loss: Secondary | ICD-10-CM | POA: Diagnosis not present

## 2017-02-18 DIAGNOSIS — M545 Low back pain: Secondary | ICD-10-CM | POA: Diagnosis not present

## 2017-02-18 DIAGNOSIS — J449 Chronic obstructive pulmonary disease, unspecified: Secondary | ICD-10-CM | POA: Diagnosis not present

## 2017-02-18 DIAGNOSIS — I10 Essential (primary) hypertension: Secondary | ICD-10-CM | POA: Diagnosis not present

## 2017-02-18 DIAGNOSIS — R42 Dizziness and giddiness: Secondary | ICD-10-CM | POA: Diagnosis not present

## 2017-03-09 DIAGNOSIS — H25011 Cortical age-related cataract, right eye: Secondary | ICD-10-CM | POA: Diagnosis not present

## 2017-03-09 DIAGNOSIS — H2511 Age-related nuclear cataract, right eye: Secondary | ICD-10-CM | POA: Diagnosis not present

## 2017-03-09 DIAGNOSIS — H21561 Pupillary abnormality, right eye: Secondary | ICD-10-CM | POA: Diagnosis not present

## 2017-03-09 DIAGNOSIS — H5703 Miosis: Secondary | ICD-10-CM | POA: Diagnosis not present

## 2017-03-09 DIAGNOSIS — H2512 Age-related nuclear cataract, left eye: Secondary | ICD-10-CM | POA: Diagnosis not present

## 2017-07-01 DIAGNOSIS — R42 Dizziness and giddiness: Secondary | ICD-10-CM | POA: Diagnosis not present

## 2017-07-01 DIAGNOSIS — J449 Chronic obstructive pulmonary disease, unspecified: Secondary | ICD-10-CM | POA: Diagnosis not present

## 2017-07-01 DIAGNOSIS — M545 Low back pain: Secondary | ICD-10-CM | POA: Diagnosis not present

## 2017-07-01 DIAGNOSIS — I10 Essential (primary) hypertension: Secondary | ICD-10-CM | POA: Diagnosis not present

## 2017-08-04 DIAGNOSIS — I10 Essential (primary) hypertension: Secondary | ICD-10-CM | POA: Diagnosis not present

## 2017-08-04 DIAGNOSIS — J449 Chronic obstructive pulmonary disease, unspecified: Secondary | ICD-10-CM | POA: Diagnosis not present

## 2017-08-04 DIAGNOSIS — M545 Low back pain: Secondary | ICD-10-CM | POA: Diagnosis not present

## 2017-08-04 DIAGNOSIS — R42 Dizziness and giddiness: Secondary | ICD-10-CM | POA: Diagnosis not present

## 2017-08-04 DIAGNOSIS — R739 Hyperglycemia, unspecified: Secondary | ICD-10-CM | POA: Diagnosis not present

## 2017-08-04 LAB — HEMOGLOBIN A1C: Hemoglobin A1C: 5.8

## 2017-08-04 LAB — PSA: PSA: 1.1

## 2017-08-18 DIAGNOSIS — D225 Melanocytic nevi of trunk: Secondary | ICD-10-CM | POA: Diagnosis not present

## 2017-08-18 DIAGNOSIS — L821 Other seborrheic keratosis: Secondary | ICD-10-CM | POA: Diagnosis not present

## 2017-10-31 DIAGNOSIS — E785 Hyperlipidemia, unspecified: Secondary | ICD-10-CM | POA: Diagnosis not present

## 2017-10-31 DIAGNOSIS — Z6827 Body mass index (BMI) 27.0-27.9, adult: Secondary | ICD-10-CM | POA: Diagnosis not present

## 2017-10-31 DIAGNOSIS — F17200 Nicotine dependence, unspecified, uncomplicated: Secondary | ICD-10-CM | POA: Diagnosis not present

## 2017-10-31 DIAGNOSIS — E663 Overweight: Secondary | ICD-10-CM | POA: Diagnosis not present

## 2017-10-31 DIAGNOSIS — I10 Essential (primary) hypertension: Secondary | ICD-10-CM | POA: Diagnosis not present

## 2017-10-31 DIAGNOSIS — H269 Unspecified cataract: Secondary | ICD-10-CM | POA: Diagnosis not present

## 2018-01-03 DIAGNOSIS — Z0000 Encounter for general adult medical examination without abnormal findings: Secondary | ICD-10-CM | POA: Diagnosis not present

## 2018-01-03 DIAGNOSIS — F17200 Nicotine dependence, unspecified, uncomplicated: Secondary | ICD-10-CM | POA: Diagnosis not present

## 2018-04-25 DIAGNOSIS — I10 Essential (primary) hypertension: Secondary | ICD-10-CM | POA: Diagnosis not present

## 2018-04-25 DIAGNOSIS — J449 Chronic obstructive pulmonary disease, unspecified: Secondary | ICD-10-CM | POA: Diagnosis not present

## 2018-04-25 DIAGNOSIS — J301 Allergic rhinitis due to pollen: Secondary | ICD-10-CM | POA: Diagnosis not present

## 2018-04-25 DIAGNOSIS — M545 Low back pain: Secondary | ICD-10-CM | POA: Diagnosis not present

## 2018-07-26 DIAGNOSIS — E785 Hyperlipidemia, unspecified: Secondary | ICD-10-CM | POA: Diagnosis not present

## 2018-07-26 DIAGNOSIS — J449 Chronic obstructive pulmonary disease, unspecified: Secondary | ICD-10-CM | POA: Diagnosis not present

## 2018-07-26 DIAGNOSIS — M545 Low back pain: Secondary | ICD-10-CM | POA: Diagnosis not present

## 2018-07-26 DIAGNOSIS — I10 Essential (primary) hypertension: Secondary | ICD-10-CM | POA: Diagnosis not present

## 2018-09-28 ENCOUNTER — Encounter: Payer: Self-pay | Admitting: Internal Medicine

## 2019-02-09 DIAGNOSIS — Z0000 Encounter for general adult medical examination without abnormal findings: Secondary | ICD-10-CM | POA: Diagnosis not present

## 2019-02-13 ENCOUNTER — Encounter (INDEPENDENT_AMBULATORY_CARE_PROVIDER_SITE_OTHER): Payer: Self-pay | Admitting: *Deleted

## 2019-02-14 DIAGNOSIS — M545 Low back pain: Secondary | ICD-10-CM | POA: Diagnosis not present

## 2019-02-14 DIAGNOSIS — E785 Hyperlipidemia, unspecified: Secondary | ICD-10-CM | POA: Diagnosis not present

## 2019-02-14 DIAGNOSIS — R200 Anesthesia of skin: Secondary | ICD-10-CM | POA: Diagnosis not present

## 2019-02-14 DIAGNOSIS — Z0000 Encounter for general adult medical examination without abnormal findings: Secondary | ICD-10-CM | POA: Diagnosis not present

## 2019-02-14 DIAGNOSIS — J449 Chronic obstructive pulmonary disease, unspecified: Secondary | ICD-10-CM | POA: Diagnosis not present

## 2019-02-14 DIAGNOSIS — I10 Essential (primary) hypertension: Secondary | ICD-10-CM | POA: Diagnosis not present

## 2019-02-14 LAB — COMPREHENSIVE METABOLIC PANEL
Albumin: 4.4 (ref 3.5–5.0)
Calcium: 9.4 (ref 8.7–10.7)
GFR calc Af Amer: 66
GFR calc non Af Amer: 57
Globulin: 2.5

## 2019-02-14 LAB — TSH: TSH: 2.31 (ref <=5.90)

## 2019-02-14 LAB — BASIC METABOLIC PANEL
BUN: 23 — AB (ref 4–21)
CO2: 31 — AB (ref 13–22)
Chloride: 104 (ref 99–108)
Creatinine: 1.2 (ref <=1.3)
Glucose: 104
Potassium: 4.3 (ref 3.4–5.3)
Sodium: 140 (ref 137–147)

## 2019-02-14 LAB — CBC AND DIFFERENTIAL
HCT: 41 (ref 41–53)
Hemoglobin: 14.7 (ref 13.5–17.5)
Platelets: 171 (ref 150–399)
WBC: 5.3

## 2019-02-14 LAB — HEPATIC FUNCTION PANEL
ALT: 20 (ref 10–40)
AST: 16 (ref 14–40)
Alkaline Phosphatase: 56 (ref 25–125)

## 2019-02-14 LAB — LIPID PANEL
Cholesterol: 182 (ref 0–200)
HDL: 36 (ref 35–70)
LDL Cholesterol: 118
Triglycerides: 163 — AB (ref 40–160)

## 2019-02-14 LAB — CBC: RBC: 4.41 (ref 3.87–5.11)

## 2019-02-22 ENCOUNTER — Encounter: Payer: Self-pay | Admitting: Nurse Practitioner

## 2019-03-29 ENCOUNTER — Other Ambulatory Visit: Payer: Self-pay

## 2019-03-29 ENCOUNTER — Ambulatory Visit: Payer: Medicare PPO | Admitting: Nurse Practitioner

## 2019-03-29 ENCOUNTER — Encounter: Payer: Self-pay | Admitting: *Deleted

## 2019-03-29 ENCOUNTER — Other Ambulatory Visit: Payer: Self-pay | Admitting: *Deleted

## 2019-03-29 ENCOUNTER — Encounter: Payer: Self-pay | Admitting: Nurse Practitioner

## 2019-03-29 VITALS — BP 163/90 | HR 72 | Temp 97.2°F | Ht 72.0 in | Wt 190.8 lb

## 2019-03-29 DIAGNOSIS — Z86010 Personal history of colonic polyps: Secondary | ICD-10-CM

## 2019-03-29 NOTE — Progress Notes (Signed)
Primary Care Physician:  Sinda Du, MD Primary Gastroenterologist:  Dr. Gala Romney  Chief Complaint  Patient presents with  . Consult    TCS. last done about 3 yrs or more ago    HPI:   Samuel Pope is a 78 y.o. male who presents on referral from primary care to schedule colonoscopy.  Nurse/phone triage was deferred to office visit due to patient age.  We have not seen the patient since 2017 when he was seen for history of adenomatous colon polyp and was arranged for colonoscopy.  Known history of hemorrhoids.   Last colonoscopy completed 10/23/2015 which found three 3 to 9 mm polyps in the cecum, diverticulosis in the sigmoid colon, otherwise normal.  Surgical pathology found the polyps to be tubular adenoma.  Recommended repeat colonoscopy in 3 years only if overall health remains good.  Today he states he's doing well. He is due for a colonoscopy. Feels in good health. Denies any new major medical problems in the past 3 years. Breathing is good. Denies DOE. Denies abdominal pain, N/V, hematochezia, melena, fever, chills, unintentional weight loss. Denies URI or flu-like symptoms. Denies loss of sense of taste or smell. Does have rare/intermittent orthostatic dizziness. Denies dyspnea, lightheadedness, syncope, near syncope. Denies any other upper or lower GI symptoms  Past Medical History:  Diagnosis Date  . Hypercholesterolemia   . Hypertension   . Prostate enlargement     Past Surgical History:  Procedure Laterality Date  . COLONOSCOPY N/A 10/23/2015   Procedure: COLONOSCOPY;  Surgeon: Daneil Dolin, MD;  Location: [facility];  Service: Endoscopy;  Laterality: N/A;  0915  . TONSILECTOMY, ADENOIDECTOMY, BILATERAL MYRINGOTOMY AND TUBES    . TRANSURETHRAL RESECTION OF PROSTATE      Current Outpatient Medications  Medication Sig Dispense Refill  . aspirin 81 MG tablet Take 81 mg by mouth as needed.     . metoprolol (LOPRESSOR) 50 MG tablet Take 25 mg by mouth 2 (two)  times daily.     . simvastatin (ZOCOR) 20 MG tablet      No current facility-administered medications for this visit.     Allergies as of 03/29/2019 - Review Complete 03/29/2019  Allergen Reaction Noted  . Penicillins Swelling 10/23/2015    Family History  Problem Relation Age of Onset  . Colon cancer Paternal Uncle   . Colon polyps Sister        "a lot of colon polyps"    Social History   Socioeconomic History  . Marital status: Married    Spouse name: Not on file  . Number of children: Not on file  . Years of education: Not on file  . Highest education level: Not on file  Occupational History  . Not on file  Social Needs  . Financial resource strain: Not on file  . Food insecurity    Worry: Not on file    Inability: Not on file  . Transportation needs    Medical: Not on file    Non-medical: Not on file  Tobacco Use  . Smoking status: Current Some Day Smoker    Packs/day: 1.00    Types: Cigarettes  . Smokeless tobacco: Never Used  Substance and Sexual Activity  . Alcohol use: Yes    Alcohol/week: 0.0 standard drinks    Comment: 1-2 beers a week  . Drug use: No  . Sexual activity: Not on file  Lifestyle  . Physical activity    Days per week: Not  on file    Minutes per session: Not on file  . Stress: Not on file  Relationships  . Social Herbalist on phone: Not on file    Gets together: Not on file    Attends religious service: Not on file    Active member of club or organization: Not on file    Attends meetings of clubs or organizations: Not on file    Relationship status: Not on file  . Intimate partner violence    Fear of current or ex partner: Not on file    Emotionally abused: Not on file    Physically abused: Not on file    Forced sexual activity: Not on file  Other Topics Concern  . Not on file  Social History Narrative  . Not on file    Review of Systems: General: Negative for anorexia, weight loss, fever, chills, fatigue,  weakness. ENT: Negative for hoarseness, difficulty swallowing. CV: Negative for chest pain, angina, palpitations, peripheral edema.  Respiratory: Negative for dyspnea at rest, cough, sputum, wheezing.  GI: See history of present illness. GU:  Denies urinary symptoms since TURP.  Derm: Negative for rash or itching.  Endo: Negative for unusual weight change.  Heme: Negative for bruising or bleeding. Allergy: Negative for rash or hives.    Physical Exam: BP (!) 163/90   Pulse 72   Temp (!) 97.2 F (36.2 C) (Oral)   Ht 6' (1.829 m)   Wt 190 lb 12.8 oz (86.5 kg)   BMI 25.88 kg/m  General:   Alert and oriented. Pleasant and cooperative. Well-nourished and well-developed.  Head:  Normocephalic and atraumatic. Eyes:  Without icterus, sclera clear and conjunctiva pink.  Ears:  Normal auditory acuity. Cardiovascular:  S1, S2 present with 4/6 blowing systolic murmur appreciated. Extremities without clubbing or edema. Respiratory:  Clear to auscultation bilaterally. No wheezes, rales, or rhonchi. No distress.  Gastrointestinal:  +BS, soft, non-tender and non-distended. No HSM noted. No guarding or rebound. No masses appreciated.  Rectal:  Deferred  Musculoskalatal:  Symmetrical without gross deformities. Neurologic:  Alert and oriented x4;  grossly normal neurologically. Psych:  Alert and cooperative. Normal mood and affect. Heme/Lymph/Immune: No excessive bruising noted.    03/29/2019 2:56 PM   Disclaimer: This note was dictated with voice recognition software. Similar sounding words can inadvertently be transcribed and may not be corrected upon review.

## 2019-03-29 NOTE — Assessment & Plan Note (Signed)
High risk for colon cancer due to personal history of colon polyps and family history of multiple polyps in primary family member and colorectal cancer in a secondary family member.  Last colonoscopy 3 years ago with polyps is largest 9 mm.  Recommended 3-year repeat exam if overall health remains good.  Today he appears in very good health.  His only past medical history is hypertension, hypercholesterolemia.  Denies significant symptoms.  Asymptomatic from a GI standpoint.  Previously tolerated conscious sedation.  Given his overall good health and family history we will proceed with what may be his last colonoscopy.  Proceed with TCS with Dr. Gala Romney in near future: the risks, benefits, and alternatives have been discussed with the patient in detail. The patient states understanding and desires to proceed.  The patient is not on any anticoagulants, anxiolytics, chronic pain medications, antidepressants, antidiabetics, or iron supplementations.  Social alcohol use, denies drug use.  Conscious sedation should be adequate for his procedure as it was for his last.

## 2019-03-29 NOTE — Patient Instructions (Signed)
Your health issues we discussed today were:   Need for colonoscopy: 1. We will schedule your colonoscopy for you 2. Further recommendations will follow your colonoscopy 3. Call us if you have any significant GI symptoms  Overall I recommend:  1. Continue your current medications 2. Return for follow-up based on recommendations made after your procedure, or as needed for GI problems 3. Call us if you have any questions or concerns   Because of recent events of COVID-19 ("Coronavirus"), follow CDC recommendations:  1. Wash your hand frequently 2. Avoid touching your face 3. Stay away from people who are sick 4. If you have symptoms such as fever, cough, shortness of breath then call your healthcare provider for further guidance 5. If you are sick, STAY AT HOME unless otherwise directed by your healthcare provider. 6. Follow directions from state and national officials regarding staying safe   At [facility] we value your feedback. You may receive a survey about your visit today. Please share your experience as we strive to create trusting relationships with our patients to provide genuine, compassionate, quality care.  We appreciate your understanding and patience as we review any laboratory studies, imaging, and other diagnostic tests that are ordered as we care for you. Our office policy is 5 business days for review of these results, and any emergent or urgent results are addressed in a timely manner for your best interest. If you do not hear from our office in 1 week, please contact us.   We also encourage the use of MyChart, which contains your medical information for your review as well. If you are not enrolled in this feature, an access code is on this after visit summary for your convenience. Thank you for allowing [place] to be involved in your care.  It was great to see you today!  I hope you have a great Fall!!

## 2019-04-01 NOTE — Progress Notes (Signed)
CC'ED TO PCP 

## 2019-04-07 DIAGNOSIS — F17200 Nicotine dependence, unspecified, uncomplicated: Secondary | ICD-10-CM | POA: Diagnosis not present

## 2019-04-07 DIAGNOSIS — M545 Low back pain: Secondary | ICD-10-CM | POA: Diagnosis not present

## 2019-04-07 DIAGNOSIS — I10 Essential (primary) hypertension: Secondary | ICD-10-CM | POA: Diagnosis not present

## 2019-04-07 DIAGNOSIS — H269 Unspecified cataract: Secondary | ICD-10-CM | POA: Diagnosis not present

## 2019-04-07 DIAGNOSIS — H04129 Dry eye syndrome of unspecified lacrimal gland: Secondary | ICD-10-CM | POA: Diagnosis not present

## 2019-04-07 DIAGNOSIS — E785 Hyperlipidemia, unspecified: Secondary | ICD-10-CM | POA: Diagnosis not present

## 2019-04-07 DIAGNOSIS — Z6824 Body mass index (BMI) 24.0-24.9, adult: Secondary | ICD-10-CM | POA: Diagnosis not present

## 2019-06-04 DIAGNOSIS — J449 Chronic obstructive pulmonary disease, unspecified: Secondary | ICD-10-CM | POA: Diagnosis not present

## 2019-06-04 DIAGNOSIS — I10 Essential (primary) hypertension: Secondary | ICD-10-CM | POA: Diagnosis not present

## 2019-06-04 DIAGNOSIS — M545 Low back pain: Secondary | ICD-10-CM | POA: Diagnosis not present

## 2019-06-04 DIAGNOSIS — E785 Hyperlipidemia, unspecified: Secondary | ICD-10-CM | POA: Diagnosis not present

## 2019-06-11 ENCOUNTER — Other Ambulatory Visit: Payer: Self-pay

## 2019-06-11 ENCOUNTER — Other Ambulatory Visit (HOSPITAL_COMMUNITY)
Admission: RE | Admit: 2019-06-11 | Discharge: 2019-06-11 | Disposition: A | Discharge status: Home / Self Care | Payer: Medicare PPO | Source: Ambulatory Visit | Attending: Internal Medicine | Admitting: Internal Medicine

## 2019-06-11 DIAGNOSIS — Z01812 Encounter for preprocedural laboratory examination: Secondary | ICD-10-CM | POA: Diagnosis not present

## 2019-06-11 DIAGNOSIS — Z20828 Contact with and (suspected) exposure to other viral communicable diseases: Secondary | ICD-10-CM | POA: Insufficient documentation

## 2019-06-11 LAB — NOVEL CORONAVIRUS, NAA: SARS-CoV-2, NAA: NOT DETECTED

## 2019-06-11 LAB — SARS CORONAVIRUS 2 (TAT 6-24 HRS): SARS Coronavirus 2: NEGATIVE

## 2019-06-12 ENCOUNTER — Encounter (HOSPITAL_COMMUNITY): Payer: Self-pay | Admitting: *Deleted

## 2019-06-12 NOTE — Progress Notes (Signed)
This patient was referred to our lung screening program.  He unfortunately does not meet criteria for the program. I have called and talked with the patient and advised of this.  I advised him to follow up with Dr. Luan Pulling for further management of any needs that he may have.  Patient verbalizes understanding.    I have faxed the denied referral back to Dr. Luan Pulling office asking them to follow up with patient as needed.

## 2019-06-13 ENCOUNTER — Encounter (HOSPITAL_COMMUNITY): Payer: Self-pay | Admitting: *Deleted

## 2019-06-13 ENCOUNTER — Encounter (HOSPITAL_COMMUNITY)
Admission: RE | Disposition: A | Discharge status: Home / Self Care | Payer: Self-pay | Source: Home / Self Care | Attending: Internal Medicine

## 2019-06-13 ENCOUNTER — Other Ambulatory Visit: Payer: Self-pay

## 2019-06-13 ENCOUNTER — Ambulatory Visit (HOSPITAL_COMMUNITY)
Admission: RE | Admit: 2019-06-13 | Discharge: 2019-06-13 | Disposition: A | Discharge status: Home / Self Care | Payer: Medicare PPO | Attending: Internal Medicine | Admitting: Internal Medicine

## 2019-06-13 DIAGNOSIS — I10 Essential (primary) hypertension: Secondary | ICD-10-CM | POA: Diagnosis not present

## 2019-06-13 DIAGNOSIS — Z1211 Encounter for screening for malignant neoplasm of colon: Secondary | ICD-10-CM | POA: Diagnosis not present

## 2019-06-13 DIAGNOSIS — D124 Benign neoplasm of descending colon: Secondary | ICD-10-CM | POA: Diagnosis not present

## 2019-06-13 DIAGNOSIS — Z800 Family history of malignant neoplasm of digestive organs: Secondary | ICD-10-CM | POA: Diagnosis not present

## 2019-06-13 DIAGNOSIS — K5730 Diverticulosis of large intestine without perforation or abscess without bleeding: Secondary | ICD-10-CM | POA: Diagnosis not present

## 2019-06-13 DIAGNOSIS — D125 Benign neoplasm of sigmoid colon: Secondary | ICD-10-CM | POA: Insufficient documentation

## 2019-06-13 DIAGNOSIS — Z8371 Family history of colonic polyps: Secondary | ICD-10-CM | POA: Diagnosis not present

## 2019-06-13 DIAGNOSIS — M545 Low back pain, unspecified: Secondary | ICD-10-CM

## 2019-06-13 DIAGNOSIS — K635 Polyp of colon: Secondary | ICD-10-CM

## 2019-06-13 DIAGNOSIS — N400 Enlarged prostate without lower urinary tract symptoms: Secondary | ICD-10-CM | POA: Diagnosis not present

## 2019-06-13 DIAGNOSIS — F17210 Nicotine dependence, cigarettes, uncomplicated: Secondary | ICD-10-CM | POA: Diagnosis not present

## 2019-06-13 DIAGNOSIS — E7800 Pure hypercholesterolemia, unspecified: Secondary | ICD-10-CM | POA: Insufficient documentation

## 2019-06-13 DIAGNOSIS — Z880 Allergy status to penicillin: Secondary | ICD-10-CM | POA: Insufficient documentation

## 2019-06-13 DIAGNOSIS — R42 Dizziness and giddiness: Secondary | ICD-10-CM

## 2019-06-13 DIAGNOSIS — R200 Anesthesia of skin: Secondary | ICD-10-CM

## 2019-06-13 DIAGNOSIS — Z86010 Personal history of colonic polyps: Secondary | ICD-10-CM

## 2019-06-13 DIAGNOSIS — E785 Hyperlipidemia, unspecified: Secondary | ICD-10-CM

## 2019-06-13 DIAGNOSIS — J449 Chronic obstructive pulmonary disease, unspecified: Secondary | ICD-10-CM

## 2019-06-13 DIAGNOSIS — L989 Disorder of the skin and subcutaneous tissue, unspecified: Secondary | ICD-10-CM

## 2019-06-13 HISTORY — PX: POLYPECTOMY: SHX5525

## 2019-06-13 HISTORY — PX: COLONOSCOPY: SHX5424

## 2019-06-13 LAB — HM COLONOSCOPY

## 2019-06-13 SURGERY — COLONOSCOPY
Anesthesia: Moderate Sedation

## 2019-06-13 MED ORDER — ONDANSETRON HCL 4 MG/2ML IJ SOLN
INTRAMUSCULAR | Status: AC
  Filled 2019-06-13: qty 2

## 2019-06-13 MED ORDER — MIDAZOLAM HCL 5 MG/5ML IJ SOLN
INTRAMUSCULAR | Status: DC | PRN
  Administered 2019-06-13: 2 mg via INTRAVENOUS
  Administered 2019-06-13 (×2): 1 mg via INTRAVENOUS

## 2019-06-13 MED ORDER — ONDANSETRON HCL 4 MG/2ML IJ SOLN
INTRAMUSCULAR | Status: DC | PRN
  Administered 2019-06-13: 4 mg via INTRAVENOUS

## 2019-06-13 MED ORDER — MIDAZOLAM HCL 5 MG/5ML IJ SOLN
INTRAMUSCULAR | Status: AC
  Filled 2019-06-13: qty 10

## 2019-06-13 MED ORDER — MEPERIDINE HCL 50 MG/ML IJ SOLN
INTRAMUSCULAR | Status: AC
  Filled 2019-06-13: qty 1

## 2019-06-13 MED ORDER — MEPERIDINE HCL 100 MG/ML IJ SOLN
INTRAMUSCULAR | Status: DC | PRN
  Administered 2019-06-13: 25 mg via INTRAVENOUS

## 2019-06-13 MED ORDER — SODIUM CHLORIDE 0.9 % IV SOLN
INTRAVENOUS | Status: DC
  Administered 2019-06-13: 1000 mL via INTRAVENOUS

## 2019-06-13 NOTE — Op Note (Signed)
[facility] Patient Name: Samuel Pope Procedure Date: 06/13/2019 6:59 AM MRN: [id number] Date of Birth: [birth year] Attending MD: Norvel Richards , MD CSN: [id number] Age: 78 Admit Type: Outpatient Procedure:                Colonoscopy Indications:              High risk colon cancer surveillance: Personal                            history of colonic polyps Providers:                Norvel Richards, MD, Charlsie Quest. Theda Sers RN, RN,                            Aram Candela Referring MD:              Medicines:                Midazolam 4 mg IV, Meperidine 25 mg IV Complications:            No immediate complications. Estimated Blood Loss:     Estimated blood loss was minimal. Procedure:                Pre-Anesthesia Assessment:                           - Prior to the procedure, a History and Physical                            was performed, and patient medications and                            allergies were reviewed. The patient's tolerance of                            previous anesthesia was also reviewed. The risks                            and benefits of the procedure and the sedation                            options and risks were discussed with the patient.                            All questions were answered, and informed consent                            was obtained. Prior Anticoagulants: The patient has                            taken no previous anticoagulant or antiplatelet                            agents. ASA Grade Assessment: II - A patient with  mild systemic disease. After reviewing the risks                            and benefits, the patient was deemed in                            satisfactory condition to undergo the procedure.                           After obtaining informed consent, the colonoscope                            was passed under direct vision. Throughout the                            procedure,  the patient's blood pressure, pulse, and                            oxygen saturations were monitored continuously. The                            [id number]-HQ190L [id number]) scope was introduced through                            the anus and advanced to the the cecum, identified                            by appendiceal orifice and ileocecal valve. The                            colonoscopy was performed without difficulty. The                            patient tolerated the procedure well. The quality                            of the bowel preparation was adequate. Scope In: 7:53:12 AM Scope Out: 8:07:48 AM Scope Withdrawal Time: 0 hours 9 minutes 7 seconds  Total Procedure Duration: 0 hours 14 minutes 36 seconds  Findings:      The perianal and digital rectal examinations were normal.      Scattered medium-mouthed diverticula were found in the sigmoid colon and       descending colon.      Two semi-pedunculated polyps were found in the sigmoid colon and       descending colon. The polyps were 4 to 6 mm in size. These polyps were       removed with a cold snare. Resection and retrieval were complete.       Estimated blood loss was minimal.      The exam was otherwise without abnormality on direct and retroflexion       views. Impression:               - Diverticulosis in the sigmoid colon and in the  descending colon.                           - Two 4 to 6 mm polyps in the sigmoid colon and in                            the descending colon, removed with a cold snare.                            Resected and retrieved.                           - The examination was otherwise normal on direct                            and retroflexion views. Moderate Sedation:      Moderate (conscious) sedation was administered by the endoscopy nurse       and supervised by the endoscopist. The following parameters were       monitored: oxygen saturation, heart rate, blood  pressure, respiratory       rate, EKG, adequacy of pulmonary ventilation, and response to care.       Total physician intraservice time was 21 minutes. Recommendation:           - Patient has a contact number available for                            emergencies. The signs and symptoms of potential                            delayed complications were discussed with the                            patient. Return to normal activities tomorrow.                            Written discharge instructions were provided to the                            patient.                           - Resume previous diet.                           - Continue present medications.                           - Repeat colonoscopy date to be determined after                            pending pathology results are reviewed for                            surveillance based on pathology results.                           -  Return to GI office (date not yet determined). Procedure Code(s):        --- Professional ---                           [phone], Colonoscopy, flexible; with removal of                            tumor(s), polyp(s), or other lesion(s) by snare                            technique                           G0500, Moderate sedation services provided by the                            same physician or other qualified health care                            professional performing a gastrointestinal                            endoscopic service that sedation supports,                            requiring the presence of an independent trained                            observer to assist in the monitoring of the                            patient's level of consciousness and physiological                            status; initial 15 minutes of intra-service time;                            patient age 72 years or older (additional time may                            be reported with [phone], as  appropriate) Diagnosis Code(s):        --- Professional ---                           Z86.010, Personal history of colonic polyps                           K63.5, Polyp of colon                           K57.30, Diverticulosis of large intestine without                            perforation or abscess without bleeding CPT copyright 2019 American Medical Association. All rights reserved. The codes documented  in this report are preliminary and upon coder review may  be revised to meet current compliance requirements. Cristopher Estimable. Siddalee Vanderheiden, MD Norvel Richards, MD 06/13/2019 8:19:26 AM This report has been signed electronically. Number of Addenda: 0

## 2019-06-13 NOTE — H&P (Signed)
@LOGO @   Primary Care Physician:  Sinda Du, MD Primary Gastroenterologist:  Dr. Gala Romney  Pre-Procedure History & Physical: HPI:  Samuel Pope is a 78 y.o. male here for surveillance colonoscopy.  Marked positive family history of colon cancer and personal history of colon polyps in first to second-degree relatives.  History of multiple colonic polyps.  1 more colonoscopy at age 66 discussed at length previously.  Patient remains in relatively good health.  Dr. Luan Pulling desires 1 more colonoscopy.  Past Medical History:  Diagnosis Date  . Hypercholesterolemia   . Hypertension   . Prostate enlargement     Past Surgical History:  Procedure Laterality Date  . COLONOSCOPY N/A 10/23/2015   Procedure: COLONOSCOPY;  Surgeon: Daneil Dolin, MD;  Location: [facility];  Service: Endoscopy;  Laterality: N/A;  0915  . TONSILECTOMY, ADENOIDECTOMY, BILATERAL MYRINGOTOMY AND TUBES    . TRANSURETHRAL RESECTION OF PROSTATE      Prior to Admission medications   Medication Sig Start Date End Date Taking? Authorizing Provider  latanoprost (XALATAN) 0.005 % ophthalmic solution Place 1 drop into both eyes at bedtime. 02/01/19  Yes [provider]  lisinopril-hydrochlorothiazide (ZESTORETIC) 20-12.5 MG tablet Take 1 tablet by mouth daily. 01/03/19  Yes [provider]    Allergies as of 03/29/2019 - Review Complete 03/29/2019  Allergen Reaction Noted  . Penicillins Swelling 10/23/2015    Family History  Problem Relation Age of Onset  . Colon cancer Paternal Uncle   . Colon polyps Sister        "a lot of colon polyps"    Social History   Socioeconomic History  . Marital status: Married    Spouse name: Not on file  . Number of children: Not on file  . Years of education: Not on file  . Highest education level: Not on file  Occupational History  . Not on file  Social Needs  . Financial resource strain: Not on file  . Food insecurity    Worry: Not on file   Inability: Not on file  . Transportation needs    Medical: Not on file    Non-medical: Not on file  Tobacco Use  . Smoking status: Current Some Day Smoker    Packs/day: 1.00    Types: Cigarettes  . Smokeless tobacco: Never Used  Substance and Sexual Activity  . Alcohol use: Yes    Alcohol/week: 0.0 standard drinks    Comment: 1-2 beers a week  . Drug use: No  . Sexual activity: Not on file  Lifestyle  . Physical activity    Days per week: Not on file    Minutes per session: Not on file  . Stress: Not on file  Relationships  . Social Herbalist on phone: Not on file    Gets together: Not on file    Attends religious service: Not on file    Active member of club or organization: Not on file    Attends meetings of clubs or organizations: Not on file    Relationship status: Not on file  . Intimate partner violence    Fear of current or ex partner: Not on file    Emotionally abused: Not on file    Physically abused: Not on file    Forced sexual activity: Not on file  Other Topics Concern  . Not on file  Social History Narrative  . Not on file    Review of Systems: See HPI, otherwise negative  ROS  Physical Exam: BP (!) 178/98   Pulse 79   Temp 97.9 F (36.6 C) (Oral)   Resp 16   SpO2 97%  General:   Alert,  Well-developed, well-nourished, pleasant and cooperative in NAD Neck:  Supple; no masses or thyromegaly. No significant cervical adenopathy. Lungs:  Clear throughout to auscultation.   No wheezes, crackles, or rhonchi. No acute distress. Heart:  Regular rate and rhythm; no murmurs, clicks, rubs,  or gallops. Abdomen: Non-distended, normal bowel sounds.  Soft and nontender without appreciable mass or hepatosplenomegaly.  Pulses:  Normal pulses noted. Extremities:  Without clubbing or edema.  Impression/Plan: 78 year old gentleman with a marked positive family history of colon cancer and colon polyps.  Personal history colonic polyps.  Here for hopefully  1 more surveillance colonoscopy per plan.  The risks, benefits, limitations, alternatives and imponderables have been reviewed with the patient. Questions have been answered. All parties are agreeable.      Notice: This dictation was prepared with Dragon dictation along with smaller phrase technology. Any transcriptional errors that result from this process are unintentional and may not be corrected upon review.

## 2019-06-13 NOTE — Discharge Instructions (Signed)
Colonoscopy Discharge Instructions  Read the instructions outlined below and refer to this sheet in the next few weeks. These discharge instructions provide you with general information on caring for yourself after you leave the hospital. Your doctor may also give you specific instructions. While your treatment has been planned according to the most current medical practices available, unavoidable complications occasionally occur. If you have any problems or questions after discharge, call Dr. Gala Romney at [phone]. ACTIVITY  You may resume your regular activity, but move at a slower pace for the next 24 hours.   Take frequent rest periods for the next 24 hours.   Walking will help get rid of the air and reduce the bloated feeling in your belly (abdomen).   No driving for 24 hours (because of the medicine (anesthesia) used during the test).    Do not sign any important legal documents or operate any machinery for 24 hours (because of the anesthesia used during the test).  NUTRITION  Drink plenty of fluids.   You may resume your normal diet as instructed by your doctor.   Begin with a light meal and progress to your normal diet. Heavy or fried foods are harder to digest and may make you feel sick to your stomach (nauseated).   Avoid alcoholic beverages for 24 hours or as instructed.  MEDICATIONS  You may resume your normal medications unless your doctor tells you otherwise.  WHAT YOU CAN EXPECT TODAY  Some feelings of bloating in the abdomen.   Passage of more gas than usual.   Spotting of blood in your stool or on the toilet paper.  IF YOU HAD POLYPS REMOVED DURING THE COLONOSCOPY:  No aspirin products for 7 days or as instructed.   No alcohol for 7 days or as instructed.   Eat a soft diet for the next 24 hours.  FINDING OUT THE RESULTS OF YOUR TEST Not all test results are available during your visit. If your test results are not back during the visit, make an appointment  with your caregiver to find out the results. Do not assume everything is normal if you have not heard from your caregiver or the medical facility. It is important for you to follow up on all of your test results.  SEEK IMMEDIATE MEDICAL ATTENTION IF:  You have more than a spotting of blood in your stool.   Your belly is swollen (abdominal distention).   You are nauseated or vomiting.   You have a temperature over 101.   You have abdominal pain or discomfort that is severe or gets worse throughout the day.   Colon polyp and diverticulosis information provided  Further recommendations to follow pending review of pathology report  Patient request, I called Tonna Boehringer at [phone] and discussed findings   Diverticulosis  Diverticulosis is a condition that develops when small pouches (diverticula) form in the wall of the large intestine (colon). The colon is where water is absorbed and stool is formed. The pouches form when the inside layer of the colon pushes through weak spots in the outer layers of the colon. You may have a few pouches or many of them. What are the causes? The cause of this condition is not known. What increases the risk? The following factors may make you more likely to develop this condition:  Being older than age 61. Your risk for this condition increases with age. Diverticulosis is rare among people younger than age 54. By age 21, many people have it.  Eating a low-fiber diet.  Having frequent constipation.  Being overweight.  Not getting enough exercise.  Smoking.  Taking over-the-counter pain medicines, like aspirin and ibuprofen.  Having a family history of diverticulosis. What are the signs or symptoms? In most people, there are no symptoms of this condition. If you do have symptoms, they may include:  Bloating.  Cramps in the abdomen.  Constipation or diarrhea.  Pain in the lower left side of the abdomen. How is this diagnosed? This  condition is most often diagnosed during an exam for other colon problems. Because diverticulosis usually has no symptoms, it often cannot be diagnosed independently. This condition may be diagnosed by:  Using a flexible scope to examine the colon (colonoscopy).  Taking an X-ray of the colon after dye has been put into the colon (barium enema).  Doing a CT scan. How is this treated? You may not need treatment for this condition if you have never developed an infection related to diverticulosis. If you have had an infection before, treatment may include:  Eating a high-fiber diet. This may include eating more fruits, vegetables, and grains.  Taking a fiber supplement.  Taking a live bacteria supplement (probiotic).  Taking medicine to relax your colon.  Taking antibiotic medicines. Follow these instructions at home:  Drink 6-8 glasses of water or more each day to prevent constipation.  Try not to strain when you have a bowel movement.  If you have had an infection before: ? Eat more fiber as directed by your health care provider or your diet and nutrition specialist (dietitian). ? Take a fiber supplement or probiotic, if your health care provider approves.  Take over-the-counter and prescription medicines only as told by your health care provider.  If you were prescribed an antibiotic, take it as told by your health care provider. Do not stop taking the antibiotic even if you start to feel better.  Keep all follow-up visits as told by your health care provider. This is important. Contact a health care provider if:  You have pain in your abdomen.  You have bloating.  You have cramps.  You have not had a bowel movement in 3 days. Get help right away if:  Your pain gets worse.  Your bloating becomes very bad.  You have a fever or chills, and your symptoms suddenly get worse.  You vomit.  You have bowel movements that are bloody or black.  You have bleeding from your  rectum. Summary  Diverticulosis is a condition that develops when small pouches (diverticula) form in the wall of the large intestine (colon).  You may have a few pouches or many of them.  This condition is most often diagnosed during an exam for other colon problems.  If you have had an infection related to diverticulosis, treatment may include increasing the fiber in your diet, taking supplements, or taking medicines. This information is not intended to replace advice given to you by your health care provider. Make sure you discuss any questions you have with your health care provider. Document Released: 03/25/2004 Document Revised: 06/10/2017 Document Reviewed: 05/17/2016 Elsevier Patient Education  [employer].  Colon Polyps  Polyps are tissue growths inside the body. Polyps can grow in many places, including the large intestine (colon). A polyp may be a round bump or a mushroom-shaped growth. You could have one polyp or several. Most colon polyps are noncancerous (benign). However, some colon polyps can become cancerous over time. Finding and removing the polyps early  can help prevent this. What are the causes? The exact cause of colon polyps is not known. What increases the risk? You are more likely to develop this condition if you:  Have a family history of colon cancer or colon polyps.  Are older than 72 or older than 45 if you are African American.  Have inflammatory bowel disease, such as ulcerative colitis or Crohn's disease.  Have certain hereditary conditions, such as: ? Familial adenomatous polyposis. ? Lynch syndrome. ? Turcot syndrome. ? Peutz-Jeghers syndrome.  Are overweight.  Smoke cigarettes.  Do not get enough exercise.  Drink too much alcohol.  Eat a diet that is high in fat and red meat and low in fiber.  Had childhood cancer that was treated with abdominal radiation. What are the signs or symptoms? Most polyps do not cause symptoms. If  you have symptoms, they may include:  Blood coming from your rectum when having a bowel movement.  Blood in your stool. The stool may look dark red or black.  Abdominal pain.  A change in bowel habits, such as constipation or diarrhea. How is this diagnosed? This condition is diagnosed with a colonoscopy. This is a procedure in which a lighted, flexible scope is inserted into the anus and then passed into the colon to examine the area. Polyps are sometimes found when a colonoscopy is done as part of routine cancer screening tests. How is this treated? Treatment for this condition involves removing any polyps that are found. Most polyps can be removed during a colonoscopy. Those polyps will then be tested for cancer. Additional treatment may be needed depending on the results of testing. Follow these instructions at home: Lifestyle  Maintain a healthy weight, or lose weight if recommended by your health care provider.  Exercise every day or as told by your health care provider.  Do not use any products that contain nicotine or tobacco, such as cigarettes and e-cigarettes. If you need help quitting, ask your health care provider.  If you drink alcohol, limit how much you have: ? 0-1 drink a day for women. ? 0-2 drinks a day for men.  Be aware of how much alcohol is in your drink. In the [place] one drink equals one 12 oz bottle of beer (355 mL), one 5 oz glass of wine (148 mL), or one 1 oz shot of hard liquor (44 mL). Eating and drinking   Eat foods that are high in fiber, such as fruits, vegetables, and whole grains.  Eat foods that are high in calcium and vitamin D, such as milk, cheese, yogurt, eggs, liver, fish, and broccoli.  Limit foods that are high in fat, such as fried foods and desserts.  Limit the amount of red meat and processed meat you eat, such as hot dogs, sausage, bacon, and lunch meats. General instructions  Keep all follow-up visits as told by your health care  provider. This is important. ? This includes having regularly scheduled colonoscopies. ? Talk to your health care provider about when you need a colonoscopy. Contact a health care provider if:  You have new or worsening bleeding during a bowel movement.  You have new or increased blood in your stool.  You have a change in bowel habits.  You lose weight for no known reason. Summary  Polyps are tissue growths inside the body. Polyps can grow in many places, including the colon.  Most colon polyps are noncancerous (benign), but some can become cancerous over time.  This condition  is diagnosed with a colonoscopy.  Treatment for this condition involves removing any polyps that are found. Most polyps can be removed during a colonoscopy. This information is not intended to replace advice given to you by your health care provider. Make sure you discuss any questions you have with your health care provider. Document Released: 03/24/2004 Document Revised: 10/13/2017 Document Reviewed: 10/13/2017 Elsevier Patient Education  2020 [employer].

## 2019-06-14 ENCOUNTER — Encounter: Payer: Self-pay | Admitting: Internal Medicine

## 2019-06-14 LAB — SURGICAL PATHOLOGY

## 2019-06-15 ENCOUNTER — Encounter (HOSPITAL_COMMUNITY): Payer: Self-pay | Admitting: Internal Medicine

## 2019-08-14 DIAGNOSIS — H401131 Primary open-angle glaucoma, bilateral, mild stage: Secondary | ICD-10-CM | POA: Diagnosis not present

## 2019-08-14 DIAGNOSIS — Z961 Presence of intraocular lens: Secondary | ICD-10-CM | POA: Diagnosis not present

## 2019-08-14 DIAGNOSIS — H2512 Age-related nuclear cataract, left eye: Secondary | ICD-10-CM | POA: Diagnosis not present

## 2019-10-23 ENCOUNTER — Encounter (INDEPENDENT_AMBULATORY_CARE_PROVIDER_SITE_OTHER): Payer: Self-pay | Admitting: Nurse Practitioner

## 2019-10-23 ENCOUNTER — Other Ambulatory Visit: Payer: Self-pay

## 2019-10-23 ENCOUNTER — Encounter (INDEPENDENT_AMBULATORY_CARE_PROVIDER_SITE_OTHER): Payer: Self-pay

## 2019-10-23 ENCOUNTER — Ambulatory Visit (INDEPENDENT_AMBULATORY_CARE_PROVIDER_SITE_OTHER): Payer: Medicare PPO | Admitting: Nurse Practitioner

## 2019-10-23 VITALS — BP 140/79 | HR 71 | Temp 97.0°F | Ht 72.0 in | Wt 191.2 lb

## 2019-10-23 DIAGNOSIS — I10 Essential (primary) hypertension: Secondary | ICD-10-CM

## 2019-10-23 DIAGNOSIS — E785 Hyperlipidemia, unspecified: Secondary | ICD-10-CM

## 2019-10-23 DIAGNOSIS — Z122 Encounter for screening for malignant neoplasm of respiratory organs: Secondary | ICD-10-CM | POA: Diagnosis not present

## 2019-10-23 DIAGNOSIS — Z139 Encounter for screening, unspecified: Secondary | ICD-10-CM

## 2019-10-23 NOTE — Progress Notes (Signed)
Subjective:  Patient ID: Samuel Pope, male    DOB: 01-15-1941  Age: 79 y.o. MRN: [id number]  CC:  Chief Complaint  Patient presents with  . Establish Care  . Hypertension      HPI  This patient arrives today to establish care.  His previous provider is now retired.  He has history significant for COPD, hyperlipidemia, hypertension, and BPH.  He has no acute complaints today, but is wondering if he can undergo screening for lung cancer.  He tells me he did have a conversation with his previous provider about doing this.  He tells me he is a current smoker and has smoked for approximately 56 years.  He tells me on average she smoked about 1 pack/day, thus his pack year is approximately 63.  He just turned 79 3 days ago.   Past Medical History:  Diagnosis Date  . Anesthesia of skin   . Chronic obstructive pulmonary disease, unspecified ([place])   . Disorder of the skin and subcutaneous tissue, unspecified   . Dizziness and giddiness   . Essential (primary) hypertension   . Hypercholesterolemia   . Hyperlipidemia, unspecified   . Hypertension   . Kidney stones   . Low back pain   . Other chest pain   . Prostate enlargement       Family History  Problem Relation Age of Onset  . Colon cancer Paternal Uncle   . Colon polyps Sister        "a lot of colon polyps"    Social History   Social History Narrative  . Not on file   Social History   Tobacco Use  . Smoking status: Current Some Day Smoker    Packs/day: 1.00    Types: Cigarettes  . Smokeless tobacco: Never Used  Substance Use Topics  . Alcohol use: Yes    Alcohol/week: 0.0 standard drinks    Comment: 1-2 beers a week     Current Meds  Medication Sig  . escitalopram (LEXAPRO) 10 MG tablet Take 10 mg by mouth daily.  . fluticasone (FLONASE) 50 MCG/ACT nasal spray Place 1 spray into both nostrils daily.  Marland Kitchen latanoprost (XALATAN) 0.005 % ophthalmic solution Place 1 drop into both eyes at bedtime.  .  metoprolol tartrate (LOPRESSOR) 50 MG tablet Take 25 mg by mouth 2 (two) times daily.    ROS:  Review of Systems  Constitutional: Negative for fever, malaise/fatigue and weight loss.  Respiratory: Positive for shortness of breath. Negative for cough.   Cardiovascular: Negative for chest pain and palpitations.  Neurological: Negative.      Objective:   Today's Vitals: BP 140/79 (BP Location: Left Arm, Patient Position: Sitting, Cuff Size: Normal)   Pulse 71   Temp (!) 97 F (36.1 C) (Temporal)   Ht 6' (1.829 m)   Wt 191 lb 3.2 oz (86.7 kg)   SpO2 95%   BMI 25.93 kg/m  Vitals with BMI 10/23/2019 06/13/2019 06/13/2019  Height '6\' 0"'  - -  Weight 191 lbs 3 oz - -  BMI 82.99 - -  Systolic 371 696 789  Diastolic 79 66 64  Pulse 71 68 65     Physical Exam Vitals reviewed.  Constitutional:      Appearance: Normal appearance.  HENT:     Head: Normocephalic and atraumatic.  Neck:     Vascular: No carotid bruit.  Cardiovascular:     Rate and Rhythm: Normal rate and regular rhythm.  Pulmonary:  Effort: Pulmonary effort is normal.     Breath sounds: Normal breath sounds.  Musculoskeletal:     Cervical back: Neck supple.  Skin:    General: Skin is warm and dry.  Neurological:     Mental Status: He is alert and oriented to person, place, and time.  Psychiatric:        Mood and Affect: Mood normal.        Behavior: Behavior normal.        Thought Content: Thought content normal.        Judgment: Judgment normal.          Assessment and Plan   1. Screening for condition   2. Essential (primary) hypertension   3. Hyperlipidemia, unspecified hyperlipidemia type   4. Encounter for screening for lung cancer      Plan: 1.,  3.  We will collect blood work today for further evaluation.  Further recommendations may be made based upon these results.  2.  He will continue on his metoprolol as prescribed.  He does not need a refill currently.  4.  We did discuss risks  and benefits of low-dose CT scan to screen for lung cancer.  We discussed that the benefit is early identification of lung cancer when it is more treatable, risks include exposure to radiation and needing to undergo additional testing if something is found which may carry different risks and benefits.  He is also aware that he may need to undergo treatment if CT scan does show any abnormalities.  I did tell him that technically he is now aged out of the recommendation thus insurance may not cover this.  He does not want pay for this out of pocket, but would like to be screened if insurance will cover this.  I will attempt to order the screening CT scan as he only just aged out of the recommendation about 3 days ago, and he does have a significant pack-year, and has a good quality of life currently.   Tests ordered Orders Placed This Encounter  Procedures  . CT CHEST LUNG CA SCREEN LOW DOSE W/O CM  . CBC  . CMP with eGFR([facility])  . Lipid Panel  . Hemoglobin A1c  . TSH      No orders of the defined types were placed in this encounter.   Patient to follow-up in 1 month or sooner as needed.  Ailene Ards, NP

## 2019-10-24 LAB — COMPLETE METABOLIC PANEL WITH GFR
AG Ratio: 1.9 (calc) (ref 1.0–2.5)
ALT: 17 U/L (ref 9–46)
AST: 16 U/L (ref 10–35)
Albumin: 4.3 g/dL (ref 3.6–5.1)
Alkaline phosphatase (APISO): 72 U/L (ref 35–144)
BUN/Creatinine Ratio: 19 (calc) (ref 6–22)
BUN: 27 mg/dL — ABNORMAL HIGH (ref 7–25)
CO2: 24 mmol/L (ref 20–32)
Calcium: 9.2 mg/dL (ref 8.6–10.3)
Chloride: 105 mmol/L (ref 98–110)
Creat: 1.39 mg/dL — ABNORMAL HIGH (ref 0.70–1.11)
GFR, Est African American: 55 mL/min/{1.73_m2} — ABNORMAL LOW (ref >=60)
GFR, Est Non African American: 47 mL/min/{1.73_m2} — ABNORMAL LOW (ref >=60)
Globulin: 2.3 g/dL (calc) (ref 1.9–3.7)
Glucose, Bld: 110 mg/dL — ABNORMAL HIGH (ref 65–99)
Potassium: 4.7 mmol/L (ref 3.5–5.3)
Sodium: 141 mmol/L (ref 135–146)
Total Bilirubin: 0.4 mg/dL (ref 0.2–1.2)
Total Protein: 6.6 g/dL (ref 6.1–8.1)

## 2019-10-24 LAB — LIPID PANEL
Cholesterol: 203 mg/dL — ABNORMAL HIGH (ref <200)
HDL: 37 mg/dL — ABNORMAL LOW (ref >=40)
LDL Cholesterol (Calc): 128 mg/dL (calc) — ABNORMAL HIGH
Non-HDL Cholesterol (Calc): 166 mg/dL (calc) — ABNORMAL HIGH (ref <130)
Total CHOL/HDL Ratio: 5.5 (calc) — ABNORMAL HIGH (ref <5.0)
Triglycerides: 233 mg/dL — ABNORMAL HIGH (ref <150)

## 2019-10-24 LAB — CBC
HCT: 43.1 % (ref 38.5–50.0)
Hemoglobin: 14.8 g/dL (ref 13.2–17.1)
MCH: 32.5 pg (ref 27.0–33.0)
MCHC: 34.3 g/dL (ref 32.0–36.0)
MCV: 94.7 fL (ref 80.0–100.0)
MPV: 11.2 fL (ref 7.5–12.5)
Platelets: 177 10*3/uL (ref 140–400)
RBC: 4.55 10*6/uL (ref 4.20–5.80)
RDW: 12.6 % (ref 11.0–15.0)
WBC: 5 10*3/uL (ref 3.8–10.8)

## 2019-10-24 LAB — TSH: TSH: 2.09 mIU/L (ref 0.40–4.50)

## 2019-10-24 LAB — HEMOGLOBIN A1C
Hgb A1c MFr Bld: 5.8 % of total Hgb — ABNORMAL HIGH (ref <5.7)
Mean Plasma Glucose: 120 (calc)
eAG (mmol/L): 6.6 (calc)

## 2019-11-12 ENCOUNTER — Ambulatory Visit (INDEPENDENT_AMBULATORY_CARE_PROVIDER_SITE_OTHER): Payer: Medicare PPO | Admitting: Nurse Practitioner

## 2019-11-16 ENCOUNTER — Ambulatory Visit (HOSPITAL_COMMUNITY): Admission: RE | Admit: 2019-11-16 | Payer: Medicare PPO | Source: Ambulatory Visit

## 2019-11-16 ENCOUNTER — Encounter (HOSPITAL_COMMUNITY): Payer: Self-pay

## 2019-11-22 ENCOUNTER — Other Ambulatory Visit: Payer: Self-pay

## 2019-11-22 ENCOUNTER — Ambulatory Visit (INDEPENDENT_AMBULATORY_CARE_PROVIDER_SITE_OTHER): Payer: Medicare PPO | Admitting: Nurse Practitioner

## 2019-11-22 ENCOUNTER — Encounter (INDEPENDENT_AMBULATORY_CARE_PROVIDER_SITE_OTHER): Payer: Self-pay | Admitting: Nurse Practitioner

## 2019-11-22 VITALS — BP 155/70 | HR 74 | Temp 98.4°F | Ht 72.0 in | Wt 191.8 lb

## 2019-11-22 DIAGNOSIS — R7303 Prediabetes: Secondary | ICD-10-CM

## 2019-11-22 DIAGNOSIS — N289 Disorder of kidney and ureter, unspecified: Secondary | ICD-10-CM

## 2019-11-22 DIAGNOSIS — W19XXXA Unspecified fall, initial encounter: Secondary | ICD-10-CM | POA: Diagnosis not present

## 2019-11-22 DIAGNOSIS — E785 Hyperlipidemia, unspecified: Secondary | ICD-10-CM

## 2019-11-22 DIAGNOSIS — Z122 Encounter for screening for malignant neoplasm of respiratory organs: Secondary | ICD-10-CM

## 2019-11-22 DIAGNOSIS — Z23 Encounter for immunization: Secondary | ICD-10-CM | POA: Diagnosis not present

## 2019-11-22 DIAGNOSIS — I10 Essential (primary) hypertension: Secondary | ICD-10-CM

## 2019-11-22 NOTE — Patient Instructions (Signed)
[facility] Dietary Recommendations for Weight Loss What to Avoid . Avoid added sugars o Often added sugar can be found in processed foods such as many condiments, dry cereals, cakes, cookies, chips, crisps, crackers, candies, sweetened drinks, etc.  o Read labels and AVOID/DECREASE use of foods with the following in their ingredient list: Sugar, fructose, high fructose corn syrup, sucrose, glucose, maltose, dextrose, molasses, cane sugar, brown sugar, any type of syrup, agave nectar, etc.   . Avoid snacking in between meals . Avoid foods made with flour o If you are going to eat food made with flour, choose those made with whole-grains; and, minimize your consumption as much as is tolerable . Avoid processed foods o These foods are generally stocked in the middle of the grocery store. Focus on shopping on the perimeter of the grocery.  . Avoid Meat  o We recommend following a plant-based diet at [facility]. Thus, we recommend avoiding meat as a general rule. Consider eating beans, legumes, eggs, and/or dairy products for regular protein sources o If you plan on eating meat limit to 4 ounces of meat at a time and choose lean options such as Fish, chicken, turkey. Avoid red meat intake such as pork and/or steak What to Include . Vegetables o GREEN LEAFY VEGETABLES: Kale, spinach, mustard greens, collard greens, cabbage, broccoli, etc. o OTHER: Asparagus, cauliflower, eggplant, carrots, peas, Brussel sprouts, tomatoes, bell peppers, zucchini, beets, cucumbers, etc. . Grains, seeds, and legumes o Beans: kidney beans, black eyed peas, garbanzo beans, black beans, pinto beans, etc. o Whole, unrefined grains: brown rice, barley, bulgur, oatmeal, etc. . Healthy fats  o Avoid highly processed fats such as vegetable oil o Examples of healthy fats: avocado, olives, virgin olive oil, dark chocolate (?72% Cocoa), nuts (peanuts, almonds, walnuts, cashews, pecans, etc.) . None to Low  Intake of Animal Sources of Protein o Meat sources: chicken, turkey, salmon, tuna. Limit to 4 ounces of meat at one time. o Consider limiting dairy sources, but when choosing dairy focus on: PLAIN Greek yogurt, cottage cheese, high-protein milk . Fruit o Choose berries  When to Eat . Intermittent Fasting: o Choosing not to eat for a specific time period, but DO FOCUS ON HYDRATION when fasting o Multiple Techniques: - Time Restricted Eating: eat 3 meals in a day, each meal lasting no more than 60 minutes, no snacks between meals - 16-18 hour fast: fast for 16 to 18 hours up to 7 days a week. Often suggested to start with 2-3 nonconsecutive days per week.  . Remember the time you sleep is counted as fasting.  . Examples of eating schedule: Fast from 7:00pm-11:00am. Eat between 11:00am-7:00pm.  - 24-hour fast: fast for 24 hours up to every other day. Often suggested to start with 1 day per week . Remember the time you sleep is counted as fasting . Examples of eating schedule:  o Eating day: eat 2-3 meals on your eating day. If doing 2 meals, each meal should last no more than 90 minutes. If doing 3 meals, each meal should last no more than 60 minutes. Finish last meal by 7:00pm. o Fasting day: Fast until 7:00pm.  o IF YOU FEEL UNWELL FOR ANY REASON/IN ANY WAY WHEN FASTING, STOP FASTING BY EATING A NUTRITIOUS SNACK OR LIGHT MEAL o ALWAYS FOCUS ON HYDRATION DURING FASTS - Acceptable Hydration sources: water, broths, tea/coffee (black tea/coffee is best but using a small amount of whole-fat dairy products in coffee/tea is acceptable).  -   Poor Hydration Sources: anything with sugar or artificial sweeteners added to it  These recommendations have been developed for patients that are actively receiving medical care from either Dr. [facility] or Estle Sabella, DNP, NP-C at [facility]. These recommendations are developed for patients with specific medical conditions and are not meant to be  distributed or used by others that are not actively receiving care from either provider listed above at [facility]. It is not appropriate to participate in the above eating plans without proper medical supervision.   Reference: Fung, J. The obesity code. [place]: [place]; 2016.   

## 2019-11-22 NOTE — Progress Notes (Signed)
Subjective:  Patient ID: Samuel Pope, male    DOB: 1941/02/21  Age: 79 y.o. MRN: [id number]  CC:  Chief Complaint  Patient presents with  . Hypertension  . Hyperlipidemia  . Follow-up  . Other    fall, health maintenance, prediabetes, renal insufficiency      HPI  This patient comes in today for the above.  Hypertension: He has a history of hypertension and continues on metoprolol 50 mg twice daily.  He tells me that he did not take his medication yet this morning.  Hyperlipidemia: He was established to this practice approximately 1 month ago.  At that time blood work was collected including lipid panel.  It did show he has hyperlipidemia.  He is not currently on statin therapy.  Fall: He tells me proximally 1 week ago he did experience a fall.  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  He did scratch his back and has developed a bruise on his lower back.  He tells me he does continue to have some soreness there, especially with certain movements and coughing.  He takes Tylenol as needed and this relieves the pain.  He tells me he did not hit his head.  He has not noted any weakness or sensation changes to his lower extremities.  He has not noticed any blood in his urine or stool.  He is not exactly sure when his last tetanus shot was given.  Health maintenance: We did discuss lung cancer screening as last office visit.  Because he is over the age of 25, insurance is denying the pay for the screening.  He would like to know if I would still recommend this today.  Prediabetes: Last blood work did show that he has an A1c of 5.8 which makes him a prediabetic.  Renal insufficiency: It was noted that he had some renal insufficiency on his last blood work draw.  At that time he was told to make sure he is hydrating regularly.   Past Medical History:  Diagnosis Date  . Anesthesia of skin     . Chronic obstructive pulmonary disease, unspecified ([place])   . Disorder of the skin and subcutaneous tissue, unspecified   . Dizziness and giddiness   . Essential (primary) hypertension   . Hypercholesterolemia   . Hyperlipidemia, unspecified   . Hypertension   . Kidney stones   . Low back pain   . Other chest pain   . Prostate enlargement       Family History  Problem Relation Age of Onset  . Colon cancer Paternal Uncle   . Colon polyps Sister        "a lot of colon polyps"    Social History   Social History Narrative  . Not on file   Social History   Tobacco Use  . Smoking status: Current Some Day Smoker    Packs/day: 1.00    Types: Cigarettes  . Smokeless tobacco: Never Used  Substance Use Topics  . Alcohol use: Yes    Alcohol/week: 0.0 standard drinks    Comment: 1-2 beers a week     Current Meds  Medication Sig  . escitalopram (LEXAPRO) 10 MG tablet Take 10 mg by mouth daily.  . fluticasone (FLONASE) 50 MCG/ACT nasal spray Place 1 spray into both nostrils daily.  Marland Kitchen latanoprost (XALATAN) 0.005 % ophthalmic solution Place 1 drop  into both eyes at bedtime.  . metoprolol tartrate (LOPRESSOR) 50 MG tablet Take 25 mg by mouth 2 (two) times daily.    ROS:  Review of Systems  Constitutional: Positive for malaise/fatigue. Negative for fever and weight loss.  Eyes: Negative for blurred vision.  Respiratory: Negative for shortness of breath.   Cardiovascular: Negative for chest pain.  Gastrointestinal: Negative for blood in stool.  Genitourinary: Negative for hematuria.  Musculoskeletal: Positive for back pain.  Neurological: Negative for dizziness and headaches.     Objective:   Today's Vitals: BP (!) 155/70 (BP Location: Left Arm, Patient Position: Sitting, Cuff Size: Normal)   Pulse 74   Temp 98.4 F (36.9 C) (Temporal)   Ht 6' (1.829 m)   Wt 191 lb 12.8 oz (87 kg)   SpO2 96%   BMI 26.01 kg/m  Vitals with BMI 11/22/2019 10/23/2019 06/13/2019   Height 6\' 0"  6\' 0"  -  Weight 191 lbs 13 oz 191 lbs 3 oz -  BMI 123XX123 [id number] -  Systolic [id number]  Diastolic 70 79 66  Pulse 74 71 68     Physical Exam Vitals reviewed.  Constitutional:      Appearance: Normal appearance.  HENT:     Head: Normocephalic and atraumatic.  Cardiovascular:     Rate and Rhythm: Normal rate and regular rhythm.     Heart sounds: Normal heart sounds.  Pulmonary:     Effort: Pulmonary effort is normal.     Breath sounds: Normal breath sounds.  Abdominal:     General: There is no abdominal bruit.  Musculoskeletal:     Cervical back: Neck supple.  Skin:    General: Skin is warm and dry.       Neurological:     Mental Status: He is alert and oriented to person, place, and time.     Sensory: Sensation is intact.     Motor: Motor function is intact. No weakness.     Gait: Gait is intact.  Psychiatric:        Mood and Affect: Mood normal.        Behavior: Behavior normal.        Thought Content: Thought content normal.        Judgment: Judgment normal.          Assessment and Plan   1. Fall, initial encounter   2. Hyperlipidemia, unspecified hyperlipidemia type   3. Essential (primary) hypertension   4. Renal insufficiency   5. Prediabetes   6. Encounter for screening for lung cancer      Plan: 1.  I do not see any worrisome findings on exam or in history as far as significant trauma to kidneys.  He does have some pain with coughing and turning, thus he may have a broken rib, but his pain is pretty mild so my suspicion for this is fairly low.  For now I recommended that he continue to take Tylenol as needed.  I have told him if his lower back starts to swell, gets more painful, or he experiences any acute shortness of breath that he needs to notify us right away.  He tells me he understands.  I will have tetanus shot administered to him today as well.  2.. 5.  Due to his age can hold off on initiating him on statin therapy for right  now, but we did discuss lifestyle changes and diet to help control his cholesterol.  I have recommended that he eat  a plant focused diet and when he eats meat choose smaller portions.  I have given him a handout discussing this.  3.  Blood pressure is elevated today, however he did admit that he did not take his blood pressure medication prior to coming to the visit.  I recommended that he take this upon leaving the office.  He tells me he understands.  4.  We will need to consider checking metabolic panel again at next office visit to see if he has chronic kidney disease.  For now I again encouraged him to continue to hydrate regularly during the day.  6.  I did discuss the [employer]PS TF recommendations and that technically because he is 79 years of age I would not recommend that he undergo lung cancer screening via CT scan.  I did let him know that if he starts experiencing progressive shortness of breath, a cough that will not go away, or starts losing weight without intention that we may need to undergo testing at that time.  He tells me he understands.   Tests ordered Orders Placed This Encounter  Procedures  . Tdap vaccine greater than or equal to 7yo IM      No orders of the defined types were placed in this encounter.   Patient to follow-up in 3 months with Dr. Anastasio Champion or sooner as needed.  Ailene Ards, NP

## 2020-02-21 ENCOUNTER — Ambulatory Visit (INDEPENDENT_AMBULATORY_CARE_PROVIDER_SITE_OTHER): Payer: Medicare PPO | Admitting: Internal Medicine

## 2020-03-11 ENCOUNTER — Encounter (INDEPENDENT_AMBULATORY_CARE_PROVIDER_SITE_OTHER): Payer: Self-pay | Admitting: Internal Medicine

## 2020-03-11 ENCOUNTER — Ambulatory Visit (INDEPENDENT_AMBULATORY_CARE_PROVIDER_SITE_OTHER): Payer: Medicare PPO | Admitting: Internal Medicine

## 2020-03-11 ENCOUNTER — Other Ambulatory Visit: Payer: Self-pay

## 2020-03-11 VITALS — BP 140/80 | HR 78 | Temp 97.9°F | Ht 72.0 in | Wt 184.2 lb

## 2020-03-11 DIAGNOSIS — H9193 Unspecified hearing loss, bilateral: Secondary | ICD-10-CM

## 2020-03-11 NOTE — Progress Notes (Signed)
Metrics: Intervention Frequency ACO  Documented Smoking Status Yearly  Screened one or more times in 24 months  Cessation Counseling or  Active cessation medication Past 24 months  Past 24 months   Guideline developer: UpToDate (See UpToDate for funding source) Date Released: 2014       Wellness Office Visit  Subjective:  Patient ID: Samuel Pope, male    DOB: 1940/10/14  Age: 79 y.o. MRN: [id number]  CC: Hearing loss HPI  This man comes in because of bilateral hearing loss.  He thinks he has wax in both his ears. Past Medical History:  Diagnosis Date  . Anesthesia of skin   . Chronic obstructive pulmonary disease, unspecified ([place])   . Disorder of the skin and subcutaneous tissue, unspecified   . Dizziness and giddiness   . Essential (primary) hypertension   . Hypercholesterolemia   . Hyperlipidemia, unspecified   . Hypertension   . Kidney stones   . Low back pain   . Other chest pain   . Prostate enlargement    Past Surgical History:  Procedure Laterality Date  . COLONOSCOPY N/A 10/23/2015   Procedure: COLONOSCOPY;  Surgeon: Daneil Dolin, MD;  Location: [facility];  Service: Endoscopy;  Laterality: N/A;  0915  . COLONOSCOPY N/A 06/13/2019   Procedure: COLONOSCOPY;  Surgeon: Daneil Dolin, MD;  Location: [facility];  Service: Endoscopy;  Laterality: N/A;  7:30am  . POLYPECTOMY  06/13/2019   Procedure: POLYPECTOMY;  Surgeon: Daneil Dolin, MD;  Location: [facility];  Service: Endoscopy;;  . TONSILECTOMY, ADENOIDECTOMY, BILATERAL MYRINGOTOMY AND TUBES    . TRANSURETHRAL RESECTION OF PROSTATE       Family History  Problem Relation Age of Onset  . Colon cancer Paternal Uncle   . Colon polyps Sister        "a lot of colon polyps"    Social History   Social History Narrative   Married for 56 years.Lives with wife,who has Dementia.Retired.   Social History   Tobacco Use  . Smoking status: Current Some Day Smoker    Packs/day: 1.00    Types:  Cigarettes  . Smokeless tobacco: Never Used  Substance Use Topics  . Alcohol use: Yes    Alcohol/week: 0.0 standard drinks    Comment: 1-2 beers a week    Current Meds  Medication Sig  . escitalopram (LEXAPRO) 10 MG tablet Take 10 mg by mouth daily.  . fluticasone (FLONASE) 50 MCG/ACT nasal spray Place 1 spray into both nostrils daily.  Marland Kitchen latanoprost (XALATAN) 0.005 % ophthalmic solution Place 1 drop into both eyes at bedtime.  . metoprolol tartrate (LOPRESSOR) 50 MG tablet Take 25 mg by mouth 2 (two) times daily.      Depression screen [facility] 2/9 10/23/2019  Decreased Interest 0  Down, Depressed, Hopeless 0  PHQ - 2 Score 0     Objective:   Today's Vitals: BP 140/80 (BP Location: Right Arm, Patient Position: Sitting, Cuff Size: Normal)   Pulse 78   Temp 97.9 F (36.6 C) (Temporal)   Ht 6' (1.829 m)   Wt 184 lb 3.2 oz (83.6 kg)   SpO2 94%   BMI 24.98 kg/m  Vitals with BMI 03/11/2020 11/22/2019 10/23/2019  Height 6\' 0"  6\' 0"  6\' 0"   Weight 184 lbs 3 oz 191 lbs 13 oz 191 lbs 3 oz  BMI 24.98 28.36 62.94  Systolic 765 465 035  Diastolic 80 70 79  Pulse 78 74 71  Physical Exam  He looks systemically well.  Examination of both ear canals does not show any wax but he may have slight congestion in both tympanic membranes.  There is no clear evidence of infection.     Assessment   1. Bilateral hearing loss, unspecified hearing loss type       Tests ordered Orders Placed This Encounter  Procedures  . Ambulatory referral to ENT     Plan: 1. I will send him to ENT for further evaluation regarding his hearing loss.  He may need hearing aids. 2. Follow-up in 3 months when we will repeat blood work.   No orders of the defined types were placed in this encounter.   [name], MD

## 2020-04-08 ENCOUNTER — Other Ambulatory Visit (INDEPENDENT_AMBULATORY_CARE_PROVIDER_SITE_OTHER): Payer: Self-pay

## 2020-04-08 MED ORDER — LISINOPRIL-HYDROCHLOROTHIAZIDE 20-12.5 MG PO TABS: 1.0000 | ORAL_TABLET | Freq: Every day | ORAL | 0 refills | Status: DC

## 2020-04-11 DIAGNOSIS — H903 Sensorineural hearing loss, bilateral: Secondary | ICD-10-CM | POA: Diagnosis not present

## 2020-04-16 DIAGNOSIS — H401131 Primary open-angle glaucoma, bilateral, mild stage: Secondary | ICD-10-CM | POA: Diagnosis not present

## 2020-06-18 ENCOUNTER — Ambulatory Visit (INDEPENDENT_AMBULATORY_CARE_PROVIDER_SITE_OTHER): Payer: Medicare PPO | Admitting: Internal Medicine

## 2020-07-17 ENCOUNTER — Encounter (INDEPENDENT_AMBULATORY_CARE_PROVIDER_SITE_OTHER): Payer: Self-pay | Admitting: Internal Medicine

## 2020-07-17 ENCOUNTER — Ambulatory Visit (INDEPENDENT_AMBULATORY_CARE_PROVIDER_SITE_OTHER): Payer: Medicare PPO | Admitting: Internal Medicine

## 2020-07-17 ENCOUNTER — Other Ambulatory Visit: Payer: Self-pay

## 2020-07-17 VITALS — BP 133/89 | HR 89 | Temp 97.5°F | Resp 19 | Ht 72.0 in | Wt 183.2 lb

## 2020-07-17 DIAGNOSIS — I10 Essential (primary) hypertension: Secondary | ICD-10-CM

## 2020-07-17 DIAGNOSIS — E785 Hyperlipidemia, unspecified: Secondary | ICD-10-CM | POA: Diagnosis not present

## 2020-07-17 DIAGNOSIS — N289 Disorder of kidney and ureter, unspecified: Secondary | ICD-10-CM

## 2020-07-17 DIAGNOSIS — R7303 Prediabetes: Secondary | ICD-10-CM | POA: Diagnosis not present

## 2020-07-17 NOTE — Progress Notes (Signed)
Metrics: Intervention Frequency ACO  Documented Smoking Status Yearly  Screened one or more times in 24 months  Cessation Counseling or  Active cessation medication Past 24 months  Past 24 months   Guideline developer: UpToDate (See UpToDate for funding source) Date Released: 2014       Wellness Office Visit  Subjective:  Patient ID: Samuel Pope, male    DOB: Apr 20, 1941  Age: 80 y.o. MRN: [id number]  CC: This man comes in for follow-up of hypertension, renal insufficiency, prediabetes and hyperlipidemia. HPI  He has no specific complaints. He takes Zestoretic for his hypertension as well as metoprolol. He also seems to continue with Lexapro for possible depression. He has hyperlipidemia has not required statin therapy. He was found to be prediabetic.  This was not followed in view of his age. Past Medical History:  Diagnosis Date  . Anesthesia of skin   . Chronic obstructive pulmonary disease, unspecified (HCC)   . Disorder of the skin and subcutaneous tissue, unspecified   . Dizziness and giddiness   . Essential (primary) hypertension   . Hypercholesterolemia   . Hyperlipidemia, unspecified   . Hypertension   . Kidney stones   . Low back pain   . Other chest pain   . Prostate enlargement    Past Surgical History:  Procedure Laterality Date  . COLONOSCOPY N/A 10/23/2015   Procedure: COLONOSCOPY;  Surgeon: Corbin Ade, MD;  [facility];  Service: Endoscopy;  Laterality: N/A;  0915  . COLONOSCOPY N/A 06/13/2019   Procedure: COLONOSCOPY;  Surgeon: Corbin Ade, MD;  [facility];  Service: Endoscopy;  Laterality: N/A;  7:30am  . POLYPECTOMY  06/13/2019   Procedure: POLYPECTOMY;  Surgeon: Corbin Ade, MD;  [facility];  Service: Endoscopy;;  . TONSILECTOMY, ADENOIDECTOMY, BILATERAL MYRINGOTOMY AND TUBES    . TRANSURETHRAL RESECTION OF PROSTATE       Family History  Problem Relation Age of Onset  . Colon cancer Paternal Uncle    . Colon polyps Sister        "a lot of colon polyps"    Social History   Social History Narrative   Was married for 56 years.Wife died in June 11, 2020- Dementia.Retired.   Social History   Tobacco Use  . Smoking status: Current Some Day Smoker    Packs/day: 1.00    Types: Cigarettes  . Smokeless tobacco: Never Used  Substance Use Topics  . Alcohol use: Yes    Alcohol/week: 0.0 standard drinks    Comment: 1-2 beers a week    Current Meds  Medication Sig  . escitalopram (LEXAPRO) 10 MG tablet Take 10 mg by mouth daily.  Marland Kitchen latanoprost (XALATAN) 0.005 % ophthalmic solution Place 1 drop into both eyes at bedtime.  Marland Kitchen lisinopril-hydrochlorothiazide (ZESTORETIC) 20-12.5 MG tablet Take 1 tablet by mouth daily.  . metoprolol tartrate (LOPRESSOR) 50 MG tablet Take 25 mg by mouth 2 (two) times daily.      Depression screen [facility] 2/9 10/23/2019  Decreased Interest 0  Down, Depressed, Hopeless 0  PHQ - 2 Score 0     Objective:   Today's Vitals: BP 133/89 (BP Location: Left Arm, Patient Position: Sitting, Cuff Size: Normal)   Pulse 89   Temp (!) 97.5 F (36.4 C) (Temporal)   Resp 19   Ht 6' (1.829 m)   Wt 183 lb 3.2 oz (83.1 kg)   SpO2 96%   BMI 24.85 kg/m  Vitals with BMI 07/17/2020  03/11/2020 11/22/2019  Height 6\' 0"  6\' 0"  6\' 0"   Weight 183 lbs 3 oz 184 lbs 3 oz 191 lbs 13 oz  BMI 24.84 24.98 26.01  Systolic 133 140  Diastolic 89 80 70  Pulse 89 78 74     Physical Exam   He looks systemically well for his age.  Blood pressure under reasonable control for his age.  He is alert and orientated but is hard of hearing.  He is going to be getting hearing aids.    Assessment   1. Essential (primary) hypertension   2. Renal insufficiency   3. Prediabetes   4. Hyperlipidemia, unspecified hyperlipidemia type       Tests ordered Orders Placed This Encounter  Procedures  . COMPLETE METABOLIC PANEL WITH GFR  . Hemoglobin A1c  . Lipid panel     Plan: 1. He will  continue with Zestoretic for his hypertension as well as metoprolol. 2. We will check renal function today. 3. We will check lipid panel and hemoglobin A1c. 4. He declined influenza and pneumonia vaccines today.  I have encouraged him to get COVID-19 booster. 5. Follow-up with in about 4 months.   No orders of the defined types were placed in this encounter.   , MD

## 2020-07-18 LAB — COMPLETE METABOLIC PANEL WITH GFR
AG Ratio: 1.6 (calc) (ref 1.0–2.5)
ALT: 20 U/L (ref 9–46)
AST: 17 U/L (ref 10–35)
Albumin: 4.3 g/dL (ref 3.6–5.1)
Alkaline phosphatase (APISO): 63 U/L (ref 35–144)
BUN/Creatinine Ratio: 19 (calc) (ref 6–22)
BUN: 27 mg/dL — ABNORMAL HIGH (ref 7–25)
CO2: 29 mmol/L (ref 20–32)
Calcium: 9.8 mg/dL (ref 8.6–10.3)
Chloride: 105 mmol/L (ref 98–110)
Creat: 1.4 mg/dL — ABNORMAL HIGH (ref 0.70–1.11)
GFR, Est African American: 54 mL/min/{1.73_m2} — ABNORMAL LOW (ref >=60)
GFR, Est Non African American: 47 mL/min/{1.73_m2} — ABNORMAL LOW (ref >=60)
Globulin: 2.7 g/dL (calc) (ref 1.9–3.7)
Glucose, Bld: 82 mg/dL (ref 65–139)
Potassium: 4.6 mmol/L (ref 3.5–5.3)
Sodium: 140 mmol/L (ref 135–146)
Total Bilirubin: 0.6 mg/dL (ref 0.2–1.2)
Total Protein: 7 g/dL (ref 6.1–8.1)

## 2020-07-18 LAB — HEMOGLOBIN A1C
Hgb A1c MFr Bld: 5.8 % of total Hgb — ABNORMAL HIGH (ref <5.7)
Mean Plasma Glucose: 120 mg/dL
eAG (mmol/L): 6.6 mmol/L

## 2020-07-18 LAB — LIPID PANEL
Cholesterol: 199 mg/dL (ref <200)
HDL: 39 mg/dL — ABNORMAL LOW (ref >=40)
LDL Cholesterol (Calc): 126 mg/dL (calc) — ABNORMAL HIGH
Non-HDL Cholesterol (Calc): 160 mg/dL (calc) — ABNORMAL HIGH (ref <130)
Total CHOL/HDL Ratio: 5.1 (calc) — ABNORMAL HIGH (ref <5.0)
Triglycerides: 198 mg/dL — ABNORMAL HIGH (ref <150)

## 2020-07-18 NOTE — Progress Notes (Signed)
Please let the patient know that his blood tests are stable.  Follow-up as scheduled.

## 2020-07-21 NOTE — Progress Notes (Signed)
Pt called today. Was given the results. Pt was happy nothing ws bad today. Ask if he could give his dog his wife UTI medications? I told him, we are not  allowed to give veterinarian medical  care . Only for adults. He was just joking, but he did want know something. I told him to call the vet's office and they could help him.

## 2020-07-30 ENCOUNTER — Other Ambulatory Visit (INDEPENDENT_AMBULATORY_CARE_PROVIDER_SITE_OTHER): Payer: Self-pay | Admitting: Nurse Practitioner

## 2020-09-01 DIAGNOSIS — H401131 Primary open-angle glaucoma, bilateral, mild stage: Secondary | ICD-10-CM | POA: Diagnosis not present

## 2020-09-01 DIAGNOSIS — Z961 Presence of intraocular lens: Secondary | ICD-10-CM | POA: Diagnosis not present

## 2020-09-01 DIAGNOSIS — H25812 Combined forms of age-related cataract, left eye: Secondary | ICD-10-CM | POA: Diagnosis not present

## 2020-10-15 DIAGNOSIS — H5703 Miosis: Secondary | ICD-10-CM | POA: Diagnosis not present

## 2020-10-15 DIAGNOSIS — H2512 Age-related nuclear cataract, left eye: Secondary | ICD-10-CM | POA: Diagnosis not present

## 2020-10-27 ENCOUNTER — Other Ambulatory Visit (INDEPENDENT_AMBULATORY_CARE_PROVIDER_SITE_OTHER): Payer: Self-pay | Admitting: Internal Medicine

## 2020-11-19 ENCOUNTER — Other Ambulatory Visit: Payer: Self-pay

## 2020-11-19 ENCOUNTER — Telehealth (INDEPENDENT_AMBULATORY_CARE_PROVIDER_SITE_OTHER): Payer: Self-pay | Admitting: Nurse Practitioner

## 2020-11-19 ENCOUNTER — Ambulatory Visit (INDEPENDENT_AMBULATORY_CARE_PROVIDER_SITE_OTHER): Payer: Medicare PPO | Admitting: Nurse Practitioner

## 2020-11-19 ENCOUNTER — Encounter (INDEPENDENT_AMBULATORY_CARE_PROVIDER_SITE_OTHER): Payer: Self-pay | Admitting: Nurse Practitioner

## 2020-11-19 VITALS — BP 116/78 | HR 60 | Temp 97.3°F | Ht 72.0 in | Wt 187.0 lb

## 2020-11-19 DIAGNOSIS — M545 Low back pain, unspecified: Secondary | ICD-10-CM | POA: Diagnosis not present

## 2020-11-19 DIAGNOSIS — I10 Essential (primary) hypertension: Secondary | ICD-10-CM

## 2020-11-19 DIAGNOSIS — R221 Localized swelling, mass and lump, neck: Secondary | ICD-10-CM

## 2020-11-19 NOTE — Telephone Encounter (Signed)
Appt set & Prior Samuel Pope is in process; pending approval.

## 2020-11-19 NOTE — Telephone Encounter (Signed)
Ordering ultrasound of mass on patient's neck

## 2020-11-19 NOTE — Progress Notes (Signed)
Subjective:  Patient ID: Samuel Pope, male    DOB: November 06, 1940  Age: 80 y.o. MRN: [id number]  CC:  Chief Complaint  Patient presents with  . Follow-up    Top of back is hurting and sometimes lower back hurts, left foot is hurting  . Back Pain  . Other    Mass to right neck  . Hypertension      HPI  This patient arrives today for the above.  Back pain: He mentions he has intermittent low and upper back pain usually in the afternoon.  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  The patient denies any sensation changes or weakness.  He is wearing what he can take for the treatment of his back pain.  He denies any new or worsening bowel or bladder incontinence.  Mass: He mentions a mass located on his right lateral neck/collarbone area.  Tells me its been present for years and has not changed in size.  It does not cause any pain or discomfort.  Hypertension: He continues on lisinopril and hydrochlorothiazide for treatment of his elevated blood pressure.  Per chart review he was on metoprolol in the past, but tells me he is not taking that currently.  He denies any history of heart attack.  Past Medical History:  Diagnosis Date  . Anesthesia of skin   . Chronic obstructive pulmonary disease, unspecified ([place])   . Disorder of the skin and subcutaneous tissue, unspecified   . Dizziness and giddiness   . Essential (primary) hypertension   . Hypercholesterolemia   . Hyperlipidemia, unspecified   . Hypertension   . Kidney stones   . Low back pain   . Other chest pain   . Prostate enlargement       Family History  Problem Relation Age of Onset  . Colon cancer Paternal Uncle   . Colon polyps Sister        "a lot of colon polyps"    Social History   Social History Narrative   Was married for 56 years.Wife died in 2020-05-23-  Dementia.Retired.   Social History   Tobacco Use  . Smoking status: Current Some Day Smoker    Packs/day: 1.00    Types: Cigarettes  . Smokeless tobacco: Never Used  Substance Use Topics  . Alcohol use: Yes    Alcohol/week: 0.0 standard drinks    Comment: 1-2 beers a week     Current Meds  Medication Sig  . latanoprost (XALATAN) 0.005 % ophthalmic solution Place 1 drop into both eyes at bedtime.  Marland Kitchen lisinopril-hydrochlorothiazide (ZESTORETIC) 20-12.5 MG tablet TAKE 1 TABLET EVERY DAY    ROS:  Review of Systems  Respiratory: Negative for shortness of breath.   Cardiovascular: Negative for chest pain.  Musculoskeletal: Positive for back pain. Negative for falls.  Neurological: Negative for sensory change and weakness.     Objective:   Today's Vitals: BP 116/78   Pulse 60   Temp (!) 97.3 F (36.3 C) (Temporal)   Ht 6' (1.829 m)   Wt 187 lb (84.8 kg)   SpO2 93%   BMI 25.36 kg/m  Vitals with BMI 11/19/2020 07/17/2020 03/11/2020  Height 6\' 0"  6\' 0"  6\' 0"   Weight 187 lbs 183 lbs 3 oz 184 lbs 3 oz  BMI 25.36 24.84  89.21  Systolic 194 174 081  Diastolic 78 89 80  Pulse 60 89 78     Physical Exam Vitals reviewed.  Constitutional:      Appearance: Normal appearance.  HENT:     Head: Normocephalic and atraumatic.  Cardiovascular:     Rate and Rhythm: Normal rate and regular rhythm.  Pulmonary:     Effort: Pulmonary effort is normal.     Breath sounds: Normal breath sounds.  Musculoskeletal:     Cervical back: Neck supple.  Skin:    General: Skin is warm and dry.       Neurological:     Mental Status: He is alert and oriented to person, place, and time.     Gait: Gait is intact.  Psychiatric:        Mood and Affect: Mood normal.        Behavior: Behavior normal.        Thought Content: Thought content normal.        Judgment: Judgment normal.          Assessment and Plan   1. Mass of neck   2. Essential (primary) hypertension   3. Low back pain,  unspecified back pain laterality, unspecified chronicity, unspecified whether sciatica present      Plan: 1.  I think this represents a cyst, however we will send him for ultrasound for further evaluation. 2.  Blood pressure well controlled on lisinopril and hydrochlorothiazide continue taking his medication as prescribed.  Will not represcribe metoprolol at this time. 3.  I recommended he try over-the-counter treatment such as Tylenol and/or ibuprofen.  We did discuss that I would prefer he take Tylenol on a daily basis and eat something every day because of his kidney disease.  He and the family member tell me they understand.  I did tell him if he experiences any sensory changes or weakness to his lower upper extremities needs to notify us.  They tell me they understand.   Tests ordered Orders Placed This Encounter  Procedures  . US Soft Tissue Head/Neck (NON-THYROID)      No orders of the defined types were placed in this encounter.   Patient to follow-up in 3 months or sooner as needed.  Ailene Ards, NP

## 2020-11-25 ENCOUNTER — Ambulatory Visit (HOSPITAL_COMMUNITY): Payer: Medicare PPO

## 2020-12-02 ENCOUNTER — Ambulatory Visit (HOSPITAL_COMMUNITY): Payer: Medicare PPO

## 2020-12-11 ENCOUNTER — Ambulatory Visit (HOSPITAL_COMMUNITY): Payer: Medicare PPO

## 2020-12-14 ENCOUNTER — Other Ambulatory Visit: Payer: Self-pay

## 2020-12-14 ENCOUNTER — Ambulatory Visit
Admission: EM | Admit: 2020-12-14 | Discharge: 2020-12-14 | Disposition: A | Discharge status: Home / Self Care | Payer: Medicare PPO | Attending: Family Medicine | Admitting: Family Medicine

## 2020-12-14 ENCOUNTER — Encounter: Payer: Self-pay | Admitting: Emergency Medicine

## 2020-12-14 DIAGNOSIS — L509 Urticaria, unspecified: Secondary | ICD-10-CM | POA: Diagnosis not present

## 2020-12-14 DIAGNOSIS — W57XXXA Bitten or stung by nonvenomous insect and other nonvenomous arthropods, initial encounter: Secondary | ICD-10-CM

## 2020-12-14 DIAGNOSIS — S20469A Insect bite (nonvenomous) of unspecified back wall of thorax, initial encounter: Secondary | ICD-10-CM | POA: Diagnosis not present

## 2020-12-14 DIAGNOSIS — R238 Other skin changes: Secondary | ICD-10-CM

## 2020-12-14 MED ORDER — DEXAMETHASONE SODIUM PHOSPHATE 10 MG/ML IJ SOLN
10.0000 mg | Freq: Once | INTRAMUSCULAR | Status: AC
  Administered 2020-12-14: 10 mg via INTRAMUSCULAR

## 2020-12-14 MED ORDER — TRIAMCINOLONE ACETONIDE 0.1 % EX CREA: 1.0000 "application " | TOPICAL_CREAM | Freq: Two times a day (BID) | CUTANEOUS | 0 refills | Status: DC

## 2020-12-14 MED ORDER — DOXYCYCLINE HYCLATE 100 MG PO CAPS: 100.0000 mg | ORAL_CAPSULE | Freq: Two times a day (BID) | ORAL | 0 refills | Status: AC

## 2020-12-14 NOTE — ED Provider Notes (Signed)
[facility]    CSN: [id number] Arrival date & time: 12/14/20  1415      History   Chief Complaint Chief Complaint  Patient presents with  . Insect Bite    HPI Samuel Pope is a 80 y.o. male.   HPI   Patient presents today with multiple skin irritation sites which is consistent with some type of an insect bite.  He reports the area is itchy and burning.  He is unaware of what has caused the irritation but reports he is outside gardening a lot.  He denies any fever, nausea or vomiting.  Past Medical History:  Diagnosis Date  . Anesthesia of skin   . Chronic obstructive pulmonary disease, unspecified ([place])   . Disorder of the skin and subcutaneous tissue, unspecified   . Dizziness and giddiness   . Essential (primary) hypertension   . Hypercholesterolemia   . Hyperlipidemia, unspecified   . Hypertension   . Kidney stones   . Low back pain   . Other chest pain   . Prostate enlargement     Patient Active Problem List   Diagnosis Date Noted  . Prediabetes 11/22/2019  . Hyperlipidemia, unspecified   . Chronic obstructive pulmonary disease, unspecified ([place])   . Anesthesia of skin   . Disorder of the skin and subcutaneous tissue, unspecified   . Essential (primary) hypertension   . Dizziness and giddiness   . Low back pain   . History of colonic polyps   . Diverticulosis of colon without hemorrhage   . History of adenomatous polyp of colon 09/30/2015    Past Surgical History:  Procedure Laterality Date  . COLONOSCOPY N/A 10/23/2015   Procedure: COLONOSCOPY;  Surgeon: Daneil Dolin, MD;  [facility];  Service: Endoscopy;  Laterality: N/A;  0915  . COLONOSCOPY N/A 06/13/2019   Procedure: COLONOSCOPY;  Surgeon: Daneil Dolin, MD;  [facility];  Service: Endoscopy;  Laterality: N/A;  7:30am  . POLYPECTOMY  06/13/2019   Procedure: POLYPECTOMY;  Surgeon: Daneil Dolin, MD;  [facility];  Service: Endoscopy;;  .  TONSILECTOMY, ADENOIDECTOMY, BILATERAL MYRINGOTOMY AND TUBES    . TRANSURETHRAL RESECTION OF PROSTATE         Home Medications    Prior to Admission medications   Medication Sig Start Date End Date Taking? Authorizing Provider  latanoprost (XALATAN) 0.005 % ophthalmic solution Place 1 drop into both eyes at bedtime. 02/01/19   [provider]  lisinopril-hydrochlorothiazide (ZESTORETIC) 20-12.5 MG tablet TAKE 1 TABLET EVERY DAY 10/27/20   [name], MD    Family History Family History  Problem Relation Age of Onset  . Colon cancer Paternal Uncle   . Colon polyps Sister        "a lot of colon polyps"    Social History Social History   Tobacco Use  . Smoking status: Current Some Day Smoker    Packs/day: 1.00    Types: Cigarettes  . Smokeless tobacco: Never Used  Vaping Use  . Vaping Use: Never used  Substance Use Topics  . Alcohol use: Yes    Alcohol/week: 0.0 standard drinks    Comment: 1-2 beers a week  . Drug use: No     Allergies   Penicillins   Review of Systems Review of Systems  Pertinent negatives listed in HPI Physical Exam Triage Vital Signs ED Triage Vitals  Enc Vitals Group     BP 12/14/20 1436 (!) 159/75  Pulse Rate 12/14/20 1436 69     Resp 12/14/20 1436 17     Temp 12/14/20 1436 98.1 F (36.7 C)     Temp Source 12/14/20 1436 Oral     SpO2 12/14/20 1437 96 %     Weight --      Height --      Head Circumference --      Peak Flow --      Pain Score 12/14/20 1437 0     Pain Loc --      Pain Edu? --      Excl. in [place]? --    No data found.  Updated Vital Signs BP (!) 159/75 (BP Location: Right Arm)   Pulse 69   Temp 98.1 F (36.7 C) (Oral)   Resp 18   SpO2 96%   Visual Acuity Right Eye Distance:   Left Eye Distance:   Bilateral Distance:    Right Eye Near:   Left Eye Near:    Bilateral Near:     Physical Exam General appearance: Alert, cooperative, no distress Head: Normocephalic, without obvious  abnormality, atraumatic Respiratory: Respirations even and unlabored, normal respiratory rate Heart: Rate and rhythm normal. No gallop or murmurs noted on exam  Abdomen: BS +, no distention, no rebound tenderness, or no mass Extremities: No gross deformities Skin: Skin color, texture, turgor normal.  Multiple papular not blistering lesions on torso and back Psych: Appropriate mood and affect. Neurologic: GCS 15 normal coordination normal gait   UC Treatments / Results  Labs (all labs ordered are listed, but only abnormal results are displayed) Labs Reviewed - No data to display  EKG   Radiology No results found.  Procedures Procedures (including critical care time)  Medications Ordered in UC Medications - No data to display  Initial Impression / Assessment and Plan / UC Course  I have reviewed the triage vital signs and the nursing notes.  Pertinent labs & imaging results that were available during my care of the patient were reviewed by me and considered in my medical decision making (see chart for details).    Generalized skin papules and urticaria , patient removed insect. Treatment per discharge instruction. Return precautions given. Final Clinical Impressions(s) / UC Diagnoses   Final diagnoses:  Generalized skin papules  Urticaria  Insect bite of back wall of thorax, unspecified location, initial encounter   Discharge Instructions   None    ED Prescriptions    Medication Sig Dispense Auth. Provider   triamcinolone cream (KENALOG) 0.1 % Apply 1 application topically 2 (two) times daily. 454 g Scot Jun, FNP   doxycycline (VIBRAMYCIN) 100 MG capsule Take 1 capsule (100 mg total) by mouth 2 (two) times daily for 7 days. 14 capsule Scot Jun, FNP     PDMP not reviewed this encounter.   Scot Jun, [place] 12/16/20 2138

## 2020-12-14 NOTE — ED Triage Notes (Signed)
Pt has gotten bitten by some ticks recently and is worried about having tick fever.  States he has been having some fatigue and body aches recently. Has red spot on LT lower back.

## 2020-12-23 ENCOUNTER — Other Ambulatory Visit: Payer: Self-pay

## 2020-12-23 ENCOUNTER — Encounter (INDEPENDENT_AMBULATORY_CARE_PROVIDER_SITE_OTHER): Payer: Self-pay | Admitting: Internal Medicine

## 2020-12-23 ENCOUNTER — Ambulatory Visit (INDEPENDENT_AMBULATORY_CARE_PROVIDER_SITE_OTHER): Payer: Medicare PPO | Admitting: Internal Medicine

## 2020-12-23 VITALS — BP 133/80 | HR 87 | Temp 98.4°F | Resp 18 | Ht 72.0 in | Wt 180.2 lb

## 2020-12-23 DIAGNOSIS — W57XXXD Bitten or stung by nonvenomous insect and other nonvenomous arthropods, subsequent encounter: Secondary | ICD-10-CM

## 2020-12-23 DIAGNOSIS — S30861D Insect bite (nonvenomous) of abdominal wall, subsequent encounter: Secondary | ICD-10-CM | POA: Diagnosis not present

## 2020-12-23 DIAGNOSIS — L03311 Cellulitis of abdominal wall: Secondary | ICD-10-CM

## 2020-12-23 MED ORDER — LEVOFLOXACIN 500 MG PO TABS: 500.0000 mg | ORAL_TABLET | Freq: Every day | ORAL | 0 refills | Status: AC

## 2020-12-23 NOTE — Progress Notes (Signed)
Recovering from tick bites.

## 2020-12-23 NOTE — Progress Notes (Signed)
Metrics: Intervention Frequency ACO  Documented Smoking Status Yearly  Screened one or more times in 24 months  Cessation Counseling or  Active cessation medication Past 24 months  Past 24 months   Guideline developer: UpToDate (See UpToDate for funding source) Date Released: 2014       Wellness Office Visit  Subjective:  Patient ID: Samuel Pope, male    DOB: [birth year]  Age: 80 y.o. MRN: [id number]  CC: Tick bites on abdominal wall. HPI  This patient presents with tick bites on the abdominal wall, several of them and he had already been seen in the urgent care for this problem 9 days ago.  He was put on doxycycline and he is complete the course.  He does not feel well, feels fatigued.  Some of the tick bites appear to have erythematous areas around them. Past Medical History:  Diagnosis Date   Anesthesia of skin    Chronic obstructive pulmonary disease, unspecified (HCC)    Disorder of the skin and subcutaneous tissue, unspecified    Dizziness and giddiness    Essential (primary) hypertension    Hypercholesterolemia    Hyperlipidemia, unspecified    Hypertension    Kidney stones    Low back pain    Other chest pain    Prostate enlargement    Past Surgical History:  Procedure Laterality Date   COLONOSCOPY N/A 10/23/2015   Procedure: COLONOSCOPY;  Surgeon: Daneil Dolin, MD;  [facility];  Service: Endoscopy;  Laterality: N/A;  0915   COLONOSCOPY N/A 06/13/2019   Procedure: COLONOSCOPY;  Surgeon: Daneil Dolin, MD;  [facility];  Service: Endoscopy;  Laterality: N/A;  7:30am   POLYPECTOMY  06/13/2019   Procedure: POLYPECTOMY;  Surgeon: Daneil Dolin, MD;  [facility];  Service: Endoscopy;;   TONSILECTOMY, ADENOIDECTOMY, BILATERAL MYRINGOTOMY AND TUBES     TRANSURETHRAL RESECTION OF PROSTATE       Family History  Problem Relation Age of Onset   Colon cancer Paternal Uncle    Colon polyps Sister        "a lot of colon polyps"     Social History   Social History Narrative   Was married for 77 years.Wife died in 03-Jun-2020- Dementia.Retired.   Social History   Tobacco Use   Smoking status: Some Days    Packs/day: 1.00    Pack years: 0.00    Types: Cigarettes   Smokeless tobacco: Never  Substance Use Topics   Alcohol use: Yes    Alcohol/week: 0.0 standard drinks    Comment: 1-2 beers a week    Current Meds  Medication Sig   latanoprost (XALATAN) 0.005 % ophthalmic solution Place 1 drop into both eyes at bedtime.   levofloxacin (LEVAQUIN) 500 MG tablet Take 1 tablet (500 mg total) by mouth daily for 7 days.   lisinopril-hydrochlorothiazide (ZESTORETIC) 20-12.5 MG tablet TAKE 1 TABLET EVERY DAY   ofloxacin (OCUFLOX) 0.3 % ophthalmic solution Place 1 drop into both eyes as directed.   triamcinolone cream (KENALOG) 0.1 % Apply 1 application topically 2 (two) times daily.     [place] Office Visit from 11/19/2020 in [facility]  PHQ-9 Total Score 0       Objective:   Today's Vitals: BP 133/80 (BP Location: Right Arm, Patient Position: Sitting, Cuff Size: Normal)   Pulse 87   Temp 98.4 F (36.9 C) (Temporal)   Resp 18   Ht 6' (1.829 m)  Wt 180 lb 3.2 oz (81.7 kg)   SpO2 96%   BMI 24.44 kg/m  Vitals with BMI 12/23/2020 12/14/2020 12/14/2020  Height 6\' 0"  - -  Weight 180 lbs 3 oz - -  BMI 85.50 - -  Systolic 158 682 574  Diastolic 80 75 75  Pulse 87 69 69     Physical Exam   There are several tick bites seen on his abdominal wall, a couple of them have surrounding cellulitis.    Assessment   1. Cellulitis of abdominal wall   2. Tick bite of abdominal wall, subsequent encounter       Tests ordered Orders Placed This Encounter  Procedures   B. burgdorfi antibodies   [facility] spotted fvr abs pnl(IgG+IgM)      Plan: 1.  I am going to give him a 7-day course of Levaquin for the cellulitis of his abdominal wall skin. 2.  I will check him for Lyme disease and  [facility] spotted fever also with blood work.    Meds ordered this encounter  Medications   levofloxacin (LEVAQUIN) 500 MG tablet    Sig: Take 1 tablet (500 mg total) by mouth daily for 7 days.    Dispense:  7 tablet    Refill:  0     Talecia Sherlin Luther Parody, MD

## 2020-12-24 ENCOUNTER — Telehealth (INDEPENDENT_AMBULATORY_CARE_PROVIDER_SITE_OTHER): Payer: Self-pay

## 2020-12-24 LAB — ROCKY MTN SPOTTED FVR ABS PNL(IGG+IGM)
RMSF IgG: NOT DETECTED
RMSF IgM: NOT DETECTED

## 2020-12-24 LAB — B. BURGDORFI ANTIBODIES: B burgdorferi Ab IgG+IgM: <0.9 index

## 2020-12-24 NOTE — Progress Notes (Signed)
Pt was called and given lab results. So given all lab results which are normal or stable. Pt was  agreeable of results.

## 2020-12-24 NOTE — Telephone Encounter (Signed)
-----   Message from [name], MD sent at 12/24/2020  2:44 PM EDT ----- Please let the patient know that both his test for Lyme disease and [facility] spotted fever and negative.

## 2020-12-24 NOTE — Progress Notes (Signed)
Please let the patient know that both his test for Lyme disease and [facility] spotted fever and negative.

## 2021-01-07 ENCOUNTER — Other Ambulatory Visit: Payer: Self-pay

## 2021-01-07 ENCOUNTER — Ambulatory Visit (HOSPITAL_COMMUNITY)
Admission: RE | Admit: 2021-01-07 | Discharge: 2021-01-07 | Disposition: A | Discharge status: Home / Self Care | Payer: Medicare PPO | Source: Ambulatory Visit | Attending: Nurse Practitioner | Admitting: Nurse Practitioner

## 2021-01-07 DIAGNOSIS — R221 Localized swelling, mass and lump, neck: Secondary | ICD-10-CM | POA: Insufficient documentation

## 2021-01-07 DIAGNOSIS — E041 Nontoxic single thyroid nodule: Secondary | ICD-10-CM | POA: Diagnosis not present

## 2021-01-13 ENCOUNTER — Telehealth (INDEPENDENT_AMBULATORY_CARE_PROVIDER_SITE_OTHER): Payer: Self-pay

## 2021-01-13 NOTE — Telephone Encounter (Signed)
Patients caregiver called and left a message that the patient has been complaining of knee/leg pain and needs to be seen.  I called the patient back and he stated that his Left knee and leg have been hurting. Caregiver came on the line also and stated that this has been going on for a week. Caregiver was concerned and I let her know that we do not have a provider in the office today and I did put patient on for Thursday 01/15/2021 at 10:20am with [name] and also advised for patient to bee seen at the [facility] on [address] Altamese Dilling stated she will discuss with patient and hopefully will take him to be examined. Patient and caregiver verbalized an understanding. Attaching Sarah for [employer].

## 2021-01-15 ENCOUNTER — Ambulatory Visit (HOSPITAL_COMMUNITY)
Admission: RE | Admit: 2021-01-15 | Discharge: 2021-01-15 | Disposition: A | Discharge status: Home / Self Care | Payer: Medicare PPO | Source: Ambulatory Visit | Attending: Nurse Practitioner | Admitting: Nurse Practitioner

## 2021-01-15 ENCOUNTER — Ambulatory Visit (INDEPENDENT_AMBULATORY_CARE_PROVIDER_SITE_OTHER): Payer: Medicare PPO | Admitting: Nurse Practitioner

## 2021-01-15 ENCOUNTER — Other Ambulatory Visit: Payer: Self-pay

## 2021-01-15 ENCOUNTER — Encounter (INDEPENDENT_AMBULATORY_CARE_PROVIDER_SITE_OTHER): Payer: Self-pay | Admitting: Nurse Practitioner

## 2021-01-15 VITALS — BP 108/66 | HR 71 | Temp 97.3°F | Ht 72.0 in | Wt 184.2 lb

## 2021-01-15 DIAGNOSIS — Z86718 Personal history of other venous thrombosis and embolism: Secondary | ICD-10-CM | POA: Diagnosis not present

## 2021-01-15 DIAGNOSIS — M79605 Pain in left leg: Secondary | ICD-10-CM

## 2021-01-15 LAB — COMPLETE METABOLIC PANEL WITH GFR
AG Ratio: 1.8 (calc) (ref 1.0–2.5)
ALT: 17 U/L (ref 9–46)
AST: 16 U/L (ref 10–35)
Albumin: 4.4 g/dL (ref 3.6–5.1)
Alkaline phosphatase (APISO): 63 U/L (ref 35–144)
BUN/Creatinine Ratio: 16 (calc) (ref 6–22)
BUN: 21 mg/dL (ref 7–25)
CO2: 28 mmol/L (ref 20–32)
Calcium: 9.8 mg/dL (ref 8.6–10.3)
Chloride: 104 mmol/L (ref 98–110)
Creat: 1.34 mg/dL — ABNORMAL HIGH (ref 0.70–1.11)
GFR, Est African American: 57 mL/min/{1.73_m2} — ABNORMAL LOW (ref >=60)
GFR, Est Non African American: 49 mL/min/{1.73_m2} — ABNORMAL LOW (ref >=60)
Globulin: 2.5 g/dL (calc) (ref 1.9–3.7)
Glucose, Bld: 80 mg/dL (ref 65–99)
Potassium: 4.7 mmol/L (ref 3.5–5.3)
Sodium: 140 mmol/L (ref 135–146)
Total Bilirubin: 0.4 mg/dL (ref 0.2–1.2)
Total Protein: 6.9 g/dL (ref 6.1–8.1)

## 2021-01-15 NOTE — Progress Notes (Signed)
   Subjective:  Patient ID: Samuel Pope, male    DOB: 04/28/1941  Age: 80 y.o. MRN: [id number]  CC:  Chief Complaint  Patient presents with   Knee Pain    Left knee swelling and started about 4 days ago and causing him to limp and also having left foot pain and swelling      HPI  This patient arrives today for an acute visit for the above.  He reports left knee pain and swelling that radiates down to his foot for the last 3-4 days. He tells me he just woke up with pain, but denies any injury to the knee. He tells me initially the pain was very severe and he had a hard time evening straightening his knee.  Over the next 3 to 4 days the pain has started to improve.  He is unable to give me a numerical value to rate the pain but tells me it is more moderate in intensity and not as severe.  He tells me now it seems to hurt when he puts any pressure on the leg but he can straighten it without pain.  He has been taking Tylenol as needed which seems to help the symptoms.  He is taking about 500 mg twice a day.  Creatinine clearance based on last CMP is about 45.  He is also been using skin patch and heating pad as needed which seems to have helped his pain as well.  Past Medical History:  Diagnosis Date   Anesthesia of skin    Chronic obstructive pulmonary disease, unspecified (HCC)    Disorder of the skin and subcutaneous tissue, unspecified    Dizziness and giddiness    Essential (primary) hypertension    Hypercholesterolemia    Hyperlipidemia, unspecified    Hypertension    Kidney stones    Low back pain    Other chest pain    Prostate enlargement       Family History  Problem Relation Age of Onset   Colon cancer Paternal Uncle    Colon polyps Sister        "a lot of colon polyps"    Social History   Social History Narrative   Was married for 56 years.Wife died in Nov 2021- Dementia.Retired.   Social History   Tobacco Use   Smoking status: Some Days    Packs/day:  1.00    Pack years: 0.00    Types: Cigarettes   Smokeless tobacco: Never  Substance Use Topics   Alcohol use: Yes    Alcohol/week: 0.0 standard drinks    Comment: 1-2 beers a week     Current Meds  Medication Sig   latanoprost (XALATAN) 0.005 % ophthalmic solution Place 1 drop into both eyes at bedtime.   lisinopril-hydrochlorothiazide (ZESTORETIC) 20-12.5 MG tablet TAKE 1 TABLET EVERY DAY   ofloxacin (OCUFLOX) 0.3 % ophthalmic solution Place 1 drop into both eyes as directed.   triamcinolone cream (KENALOG) 0.1 % Apply 1 application topically 2 (two) times daily.    ROS:  Review of Systems  Respiratory:  Negative for shortness of breath.   Cardiovascular:  Negative for chest pain.  Musculoskeletal:  Positive for joint pain (and swelling).  Neurological:  Negative for dizziness and headaches.    Objective:   Today's Vitals: BP 108/66   Pulse 71   Temp (!) 97.3 F (36.3 C) (Temporal)   Ht 6' (1.829 m)   Wt 184 lb 3.2 oz (83.6 kg)     SpO2 96%   BMI 24.98 kg/m  Vitals with BMI 01/15/2021 12/23/2020 12/14/2020  Height 6' 0" 6' 0" -  Weight 184 lbs 3 oz 180 lbs 3 oz -  BMI 24.98 24.43 -  Systolic 108 133 159  Diastolic 66 80 75  Pulse 71 87 69     Physical Exam Vitals reviewed.  Constitutional:      Appearance: Normal appearance.  HENT:     Head: Normocephalic and atraumatic.  Cardiovascular:     Rate and Rhythm: Normal rate and regular rhythm.  Pulmonary:     Effort: Pulmonary effort is normal.     Breath sounds: Normal breath sounds.  Musculoskeletal:     Cervical back: Neck supple.     Right knee: No swelling, effusion or erythema. No tenderness.     Left knee: Swelling present. No effusion. No tenderness.  Skin:    General: Skin is warm and dry.  Neurological:     Mental Status: He is alert and oriented to person, place, and time.  Psychiatric:        Mood and Affect: Mood normal.        Behavior: Behavior normal.        Thought Content: Thought content  normal.        Judgment: Judgment normal.         Assessment and Plan   1. Left leg pain      Plan: Will send for STAT u/s to r/o DVT. Pain has been improving over the last few days so patient was told to elevate, rest, use ice/heat, and 500mg of tylenol every 8 hours as needed for pain. If negative for DVT, but if symptoms persist for an additional week he will need to call our office for further evaluation.    Tests ordered Orders Placed This Encounter  Procedures   US Venous Img Lower Unilateral Left   CMP with eGFR([facility])      No orders of the defined types were placed in this encounter.   Patient to follow-up in 1 month as scheduled or sooner as needed.   SARAH E GRAY, NP  

## 2021-01-19 ENCOUNTER — Telehealth (INDEPENDENT_AMBULATORY_CARE_PROVIDER_SITE_OTHER): Payer: Self-pay

## 2021-01-19 ENCOUNTER — Other Ambulatory Visit (INDEPENDENT_AMBULATORY_CARE_PROVIDER_SITE_OTHER): Payer: Self-pay | Admitting: Nurse Practitioner

## 2021-01-19 DIAGNOSIS — M109 Gout, unspecified: Secondary | ICD-10-CM

## 2021-01-19 MED ORDER — COLCHICINE 0.6 MG PO TABS: ORAL_TABLET | ORAL | 0 refills | Status: DC

## 2021-01-19 NOTE — Progress Notes (Signed)
I called this patient and spoke to him and his caregiver.  They tell me that his left great toe and left foot are swollen.  They deny any significant redness but do mention that looks mildly red.  No other change in color such as dark purple or black noted.  Patient tells me that the left toe is very tender but he has taken some Tylenol which has really relieved the pain.  They deny any fever.  Because his left leg and knee was evaluated last week I feel comfortable prescribing him medication for what I think is a gout flare.  However, I told the patient and the caregiver that if his symptoms are not improved by 24 hours the patient needs to call our office and if were able to see him we will evaluate his foot further if we are not he will need to go to an urgent care.  They both expressed their understanding.  Patient's creatinine is above 30 so I will prescribe colchicine 1.2 mg by mouth once, with an additional 0.6 mg by mouth in 1 hour if symptoms persist.  He will also continue taking Tylenol as needed for the pain.  Megan, please call this patient and let him know of the colchicine dosing recommendations.  Again it is 1.2 mg by mouth (2 pills) once he fills it, then if symptoms persist after an hour he can take 1 additional pill which is 0.6 mg by mouth.  He should also continue to take Tylenol as needed.  Please call my phone if he has any questions.  Rx sent to [facility] on [address]. Thank you.

## 2021-01-19 NOTE — Telephone Encounter (Signed)
Patient called and left a voice message and stated that his left ankle and foot are swelling and red.  Called the patient back and he stated that his left toe is really red and swollen and also his left foot and left ankle are red and swollen and he is having a hard time walking on it and painful. Patient stated that he has been elevating his leg and not helping much and he soaked his left foot in epsom salt last night and that made it worse. Patient is requesting something be sent in to help?

## 2021-01-19 NOTE — Progress Notes (Signed)
Called patient and gave him the message from [name]. Patient verbalized an understanding and was advised to call us back if he has any concerns.

## 2021-01-20 ENCOUNTER — Telehealth (INDEPENDENT_AMBULATORY_CARE_PROVIDER_SITE_OTHER): Payer: Self-pay | Admitting: Nurse Practitioner

## 2021-01-20 NOTE — Telephone Encounter (Signed)
Thanks

## 2021-01-20 NOTE — Telephone Encounter (Signed)
Okay, thanks

## 2021-01-20 NOTE — Telephone Encounter (Signed)
I called this patient this morning to follow-up on his left toe pain and foot pain.  After taking the colchicine yesterday he tells me his pain is much better in his toe and foot.  But he does report still some pain in his lower leg.  He is unable to describe whether or not it is red or swollen when asked, but does report some mild tenderness still.  He tells me the colchicine really did help the pain from yesterday.  I recommended that he come to the office for physical exam of that left leg.  He is agreeable to do this and I have scheduled him with Dr. Anastasio Champion on 01/21/2021 at 9:40 AM.

## 2021-01-21 ENCOUNTER — Ambulatory Visit (INDEPENDENT_AMBULATORY_CARE_PROVIDER_SITE_OTHER): Payer: Medicare PPO | Admitting: Internal Medicine

## 2021-01-21 ENCOUNTER — Encounter (INDEPENDENT_AMBULATORY_CARE_PROVIDER_SITE_OTHER): Payer: Self-pay | Admitting: Internal Medicine

## 2021-01-21 ENCOUNTER — Other Ambulatory Visit: Payer: Self-pay

## 2021-01-21 VITALS — BP 136/72 | HR 76 | Temp 97.3°F | Ht 72.0 in | Wt 183.2 lb

## 2021-01-21 DIAGNOSIS — M109 Gout, unspecified: Secondary | ICD-10-CM

## 2021-01-21 NOTE — Progress Notes (Signed)
Metrics: Intervention Frequency ACO  Documented Smoking Status Yearly  Screened one or more times in 24 months  Cessation Counseling or  Active cessation medication Past 24 months  Past 24 months   Guideline developer: UpToDate (See UpToDate for funding source) Date Released: 2014       Wellness Office Visit  Subjective:  Patient ID: Samuel Pope, male    DOB: 1940/12/24  Age: 80 y.o. MRN: [id number]  CC: Follow-up of acute gout. HPI Samuel Pope had seen this patient with left knee pain and left big toe pain and swelling.  She had started him on colchicine and this seems to have helped him significantly to the point where everything has resolved.  Past Medical History:  Diagnosis Date   Anesthesia of skin    Chronic obstructive pulmonary disease, unspecified (HCC)    Disorder of the skin and subcutaneous tissue, unspecified    Dizziness and giddiness    Essential (primary) hypertension    Hypercholesterolemia    Hyperlipidemia, unspecified    Hypertension    Kidney stones    Low back pain    Other chest pain    Prostate enlargement    Past Surgical History:  Procedure Laterality Date   COLONOSCOPY N/A 10/23/2015   Procedure: COLONOSCOPY;  Surgeon: Daneil Dolin, MD;  Location: [facility];  Service: Endoscopy;  Laterality: N/A;  0915   COLONOSCOPY N/A 06/13/2019   Procedure: COLONOSCOPY;  Surgeon: Daneil Dolin, MD;  Location: [facility];  Service: Endoscopy;  Laterality: N/A;  7:30am   POLYPECTOMY  06/13/2019   Procedure: POLYPECTOMY;  Surgeon: Daneil Dolin, MD;  Location: [facility];  Service: Endoscopy;;   TONSILECTOMY, ADENOIDECTOMY, BILATERAL MYRINGOTOMY AND TUBES     TRANSURETHRAL RESECTION OF PROSTATE       Family History  Problem Relation Age of Onset   Colon cancer Paternal Uncle    Colon polyps Sister        "a lot of colon polyps"    Social History   Social History Narrative   Was married for 16 years.Wife died in 2020-06-14- Dementia.Retired.    Social History   Tobacco Use   Smoking status: Some Days    Packs/day: 1.00    Pack years: 0.00    Types: Cigarettes   Smokeless tobacco: Never  Substance Use Topics   Alcohol use: Yes    Alcohol/week: 0.0 standard drinks    Comment: 1-2 beers a week    Current Meds  Medication Sig   colchicine 0.6 MG tablet Take 2 tablets by mouth once, then if pain is still present in 1 hour take 1 additional tablet by mouth   latanoprost (XALATAN) 0.005 % ophthalmic solution Place 1 drop into both eyes at bedtime.   lisinopril-hydrochlorothiazide (ZESTORETIC) 20-12.5 MG tablet TAKE 1 TABLET EVERY DAY   ofloxacin (OCUFLOX) 0.3 % ophthalmic solution Place 1 drop into both eyes as directed.   triamcinolone cream (KENALOG) 0.1 % Apply 1 application topically 2 (two) times daily.     [facility] Office Visit from 01/21/2021 in [facility]  PHQ-9 Total Score 0       Objective:   Today's Vitals: BP 136/72   Pulse 76   Temp (!) 97.3 F (36.3 C) (Temporal)   Ht 6' (1.829 m)   Wt 183 lb 3.2 oz (83.1 kg)   SpO2 99%   BMI 24.85 kg/m  Vitals with BMI 01/21/2021 01/15/2021 12/23/2020  Height 6\' 0"  6\' 0"   6\' 0"   Weight 183 lbs 3 oz 184 lbs 3 oz 180 lbs 3 oz  BMI 24.84 29.19 16.60  Systolic 600 459 977  Diastolic 72 66 80  Pulse 76 71 87     Physical Exam  He looks systemically well and examination of the left leg does not show any abnormalities today.  I see that the venous Doppler also was negative for DVT in the left leg.     Assessment   1. 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  Appears that this was the first attack of gout for this patient.  We will check a uric acid and if it is elevated, I think he would benefit from preventative allopurinol. 2.  Follow-up with Samuel Pope as scheduled in about a month's time.    No orders of the defined types were  placed in this encounter.   Samuel Albee, MD

## 2021-01-22 ENCOUNTER — Other Ambulatory Visit (INDEPENDENT_AMBULATORY_CARE_PROVIDER_SITE_OTHER): Payer: Self-pay | Admitting: Internal Medicine

## 2021-01-22 LAB — URIC ACID: Uric Acid, Serum: 9.4 mg/dL — ABNORMAL HIGH (ref 4.0–8.0)

## 2021-01-22 MED ORDER — ALLOPURINOL 100 MG PO TABS: 100.0000 mg | ORAL_TABLET | Freq: Every day | ORAL | 3 refills | Status: DC

## 2021-01-22 NOTE — Progress Notes (Signed)
Please call the patient and let him know that the uric acid level for gout was high and I want him to start on a medication called allopurinol which he takes once a day every day.  I have sent the prescription to [facility] on [address].  He should start taking it now.

## 2021-02-04 ENCOUNTER — Telehealth (INDEPENDENT_AMBULATORY_CARE_PROVIDER_SITE_OTHER): Payer: Self-pay

## 2021-02-04 DIAGNOSIS — U071 COVID-19: Secondary | ICD-10-CM

## 2021-02-04 MED ORDER — MOLNUPIRAVIR EUA 200MG CAPSULE: 4.0000 | ORAL_CAPSULE | Freq: Two times a day (BID) | ORAL | 0 refills | Status: AC

## 2021-02-04 NOTE — Telephone Encounter (Signed)
Called patient and left a detailed voice message on instructions per the message from [name].

## 2021-02-04 NOTE — Telephone Encounter (Signed)
Please call this patient or his daughter back and let him know I am to prescribe him Molnupiravir.  He needs to take 4 capsules by mouth in the morning and 4 capsules by mouth in the evening for 5 days.  Common negative side effects are diarrhea, nausea, and dizziness.  If he has any questions or feels unwell on the medication please have him call our office.  Thank you.

## 2021-02-04 NOTE — Telephone Encounter (Signed)
Received a VM from patients daughter and she stated that the patient tested Positive for Covid yesterday 02/03/2021.  I called the patient and he stated that he started having symptoms yesterday of cough, head congestion, headache and stated he felt like he has a cold. Patient stated that he would like to have the medication for Covid sent to [facility] on [address]

## 2021-02-16 ENCOUNTER — Telehealth (INDEPENDENT_AMBULATORY_CARE_PROVIDER_SITE_OTHER): Payer: Self-pay

## 2021-02-16 NOTE — Telephone Encounter (Signed)
Patient stated that since having Covid, every morning his head is stopped up and he is sweating a lot and feels like he has a cold and not clearing up. He is wanting to know what he can take to feel better? Patient is also stating that he started taking a pill in the mornings to help and it is not helping. I advised that if he is taking a decongestant to stop taking this and let us know what he is taking. Can you help with this?

## 2021-02-18 ENCOUNTER — Ambulatory Visit: Admission: EM | Admit: 2021-02-18 | Discharge: 2021-02-18 | Discharge status: Absent without leave | Payer: Medicare PPO

## 2021-02-18 ENCOUNTER — Other Ambulatory Visit: Payer: Self-pay

## 2021-02-18 NOTE — Telephone Encounter (Signed)
Called patient and he stated that he is breathing fine and he also stated that he feels okay when he gets up in the morning and as the day goes on he starts getting head congestion and feels like he has a cold and doesn't feel well and then when he lays back down at night he feels better. Patient doesn't know if this is allergies or what it is.   Do you want patient to come into office? Or do you want to do a telephone call?

## 2021-02-18 NOTE — Telephone Encounter (Signed)
Called patient and let him know to keep his appointment with [place] in office tomorrow at 9am. Patient verbalized an understanding and thanked [place].

## 2021-02-19 ENCOUNTER — Encounter (INDEPENDENT_AMBULATORY_CARE_PROVIDER_SITE_OTHER): Payer: Self-pay | Admitting: Nurse Practitioner

## 2021-02-19 ENCOUNTER — Ambulatory Visit (INDEPENDENT_AMBULATORY_CARE_PROVIDER_SITE_OTHER): Payer: Medicare PPO | Admitting: Nurse Practitioner

## 2021-02-19 ENCOUNTER — Other Ambulatory Visit (INDEPENDENT_AMBULATORY_CARE_PROVIDER_SITE_OTHER): Payer: Self-pay

## 2021-02-19 VITALS — BP 108/72 | HR 68 | Temp 97.4°F | Ht 72.0 in | Wt 179.0 lb

## 2021-02-19 DIAGNOSIS — J0110 Acute frontal sinusitis, unspecified: Secondary | ICD-10-CM | POA: Diagnosis not present

## 2021-02-19 DIAGNOSIS — M109 Gout, unspecified: Secondary | ICD-10-CM

## 2021-02-19 MED ORDER — FLUTICASONE PROPIONATE 50 MCG/ACT NA SUSP: 2.0000 | Freq: Every day | NASAL | 6 refills | Status: DC

## 2021-02-19 MED ORDER — DOXYCYCLINE HYCLATE 100 MG PO CAPS: 100.0000 mg | ORAL_CAPSULE | Freq: Two times a day (BID) | ORAL | 0 refills | Status: DC

## 2021-02-19 MED ORDER — LISINOPRIL-HYDROCHLOROTHIAZIDE 20-12.5 MG PO TABS: 1.0000 | ORAL_TABLET | Freq: Every day | ORAL | 0 refills | Status: DC

## 2021-02-19 MED ORDER — COLCHICINE 0.6 MG PO TABS
ORAL_TABLET | ORAL | 0 refills | Status: DC
Start: 2021-02-19 — End: 2021-11-17

## 2021-02-19 MED ORDER — FEXOFENADINE HCL 60 MG PO TABS
60.0000 mg | ORAL_TABLET | Freq: Every day | ORAL | 1 refills | Status: DC
Start: 2021-02-19 — End: 2021-07-17

## 2021-02-19 NOTE — Patient Instructions (Addendum)
Tylenol - take 566m by mouth every 6 hours as needed for back pain.    Doxycycline Capsules or Tablets What is this medication? DOXYCYCLINE (dox i SYE kleen) treats infections caused by bacteria. It belongs to a group of medications called tetracycline antibiotics. It will not treatcolds, the flu, or infections caused by viruses. This medicine may be used for other purposes; ask your health care provider orpharmacist if you have questions. COMMON BRAND NAME(S): Acticlate, Adoxa, Adoxa CK, Adoxa Pak, Adoxa TT, Alodox, Avidoxy, Doxal, LYMEPAK, Mondoxyne NL, Monodox, Morgidox 1x, Morgidox 1x Kit, Morgidox 2x, Morgidox 2x Kit, NutriDox, Ocudox, OSardis Periostat, TARGADOX,Vibra-Tabs, Vibramycin What should I tell my care team before I take this medication? They need to know if you have any of these conditions: Kidney disease Liver disease Long exposure to sunlight like working outdoors Recent stomach surgery Stomach or intestine problems such as colitis Vision Problems Yeast or fungal infection of the mouth or vagina An unusual or allergic reaction to doxycycline, tetracycline antibiotics, other medications, foods, dyes, or preservatives Pregnant or trying to get pregnant Breast-feeding How should I use this medication? Take this medication by mouth with water. Take it as directed on the prescription label at the same time every day. It is best to take this medication without food, but if it upsets your stomach take it with food. Take all of this medication unless your care team tells you to stop it early. Keeptaking it even if you think you are better. Take antacids and products with aluminum, calcium, magnesium, iron, and zinc in them at a different time of day than this medication. Talk to your care team ifyou have questions. Talk to your care team regarding the use of this medication in children. Whilethis medication may be prescribed for selected conditions, precautions do  apply. Overdosage: If you think you have taken too much of this medicine contact apoison control center or emergency room at once. NOTE: This medicine is only for you. Do not share this medicine with others. What if I miss a dose? If you miss a dose, take it as soon as you can. If it is almost time for yournext dose, take only that dose. Do not take double or extra doses. What may interact with this medication? Antacids, vitamins, or other products that contain aluminum, calcium, iron, magnesium, or zinc Barbiturates Birth control pills Bismuth subsalicylate Carbamazepine Methoxyflurane Oral retinoids such as acitretin, isotretinoin Other antibiotics Phenytoin Warfarin This list may not describe all possible interactions. Give your health care provider a list of all the medicines, herbs, non-prescription drugs, or dietary supplements you use. Also tell them if you smoke, drink alcohol, or use illegaldrugs. Some items may interact with your medicine. What should I watch for while using this medication? Tell your care team if your symptoms do not improve. Do not treat diarrhea with over the counter products. Contact your care team ifyou have diarrhea that lasts more than 2 days or if it is severe and watery. Do not take this medication just before going to bed. It may not dissolve properly when you lay down and can cause pain in your throat. Drink plenty of fluids while taking this medication to also help reduce irritation in yourthroat. This medication can make you more sensitive to the sun. Keep out of the sun. If you cannot avoid being in the sun, wear protective clothing and use sunscreen.Do not use sun lamps or tanning beds/booths. Birth control pills may not work properly while you are taking  this medication.Talk to your care team about using an extra method of birth control. If you are being treated for a sexually transmitted infection, avoid sexual contact until you have finished your  treatment. Your sexual partner may alsoneed treatment. If you are using this medication to prevent malaria, you should still protect yourself from contact with mosquitos. Stay in screened-in areas, use mosquitonets, keep your body covered, and use an insect repellent. What side effects may I notice from receiving this medication? Side effects that you should report to your care team as soon as possible: Allergic reactions-skin rash, itching, hives, swelling of the face, lips, tongue, or throat Increased pressure around the brain-severe headache, change in vision, blurry vision, nausea, vomiting Joint pain Pain or trouble swallowing Redness, blistering, peeling, or loosening of the skin, including inside the mouth Severe diarrhea, fever Unusual vaginal discharge, itching, or odor Side effects that usually do not require medical attention (report these toyour care team if they continue or are bothersome): Change in tooth color Diarrhea Headache Heartburn Nausea This list may not describe all possible side effects. Call your doctor for medical advice about side effects. You may report side effects to FDA at1-800-FDA-1088. Where should I keep my medication? Keep out of the reach of children and pets. Store at room temperature, below 30 degrees C (86 degrees F). Protect from light. Keep container tightly closed. Throw away any unused medication after the expiration date. Taking this medication after the expiration date can makeyou seriously ill. NOTE: This sheet is a summary. It may not cover all possible information. If you have questions about this medicine, talk to your doctor, pharmacist, orhealth care provider.  2022 Elsevier/Gold Standard (2020-08-28 14:12:28)

## 2021-02-19 NOTE — Progress Notes (Signed)
Subjective:  Patient ID: Samuel Pope, male    DOB: July 07, 1941  Age: 80 y.o. MRN: [id number]  CC:  Chief Complaint  Patient presents with   Follow-up    Having congestion throughout the day and not feeling well. Feels okay when wakes up or goes to bed. Had Covid several weeks ago      HPI  This patient arrives today for the above.  He tells me for the last 3 to 4 weeks has been experiencing head congestion, headache, some sneezing, some coughing, and intermittent chills but denies checking his temperature.  He tells me the symptoms are the worst during the day but seem to improve when he lays down.  Past Medical History:  Diagnosis Date   Anesthesia of skin    Chronic obstructive pulmonary disease, unspecified (HCC)    Disorder of the skin and subcutaneous tissue, unspecified    Dizziness and giddiness    Essential (primary) hypertension    Hypercholesterolemia    Hyperlipidemia, unspecified    Hypertension    Kidney stones    Low back pain    Other chest pain    Prostate enlargement       Family History  Problem Relation Age of Onset   Colon cancer Paternal Uncle    Colon polyps Sister        "a lot of colon polyps"    Social History   Social History Narrative   Was married for 56 years.Wife died in Jun 09, 2020- Dementia.Retired.   Social History   Tobacco Use   Smoking status: Some Days    Packs/day: 1.00    Types: Cigarettes   Smokeless tobacco: Never  Substance Use Topics   Alcohol use: Yes    Alcohol/week: 0.0 standard drinks    Comment: 1-2 beers a week     Current Meds  Medication Sig   acetaminophen (TYLENOL) 500 MG tablet Take 500 mg by mouth every 6 (six) hours as needed.   allopurinol (ZYLOPRIM) 100 MG tablet Take 1 tablet (100 mg total) by mouth daily.   colchicine 0.6 MG tablet Take 2 tablets by mouth once, then if pain is still present in 1 hour take 1 additional tablet by mouth   doxycycline (VIBRAMYCIN) 100 MG capsule Take 1  capsule (100 mg total) by mouth 2 (two) times daily.   fexofenadine (ALLEGRA ALLERGY) 60 MG tablet Take 1 tablet (60 mg total) by mouth daily.   fluticasone (FLONASE) 50 MCG/ACT nasal spray Place 2 sprays into both nostrils daily.   latanoprost (XALATAN) 0.005 % ophthalmic solution Place 1 drop into both eyes at bedtime.   lisinopril-hydrochlorothiazide (ZESTORETIC) 20-12.5 MG tablet TAKE 1 TABLET EVERY DAY   ofloxacin (OCUFLOX) 0.3 % ophthalmic solution Place 1 drop into both eyes as directed.   triamcinolone cream (KENALOG) 0.1 % Apply 1 application topically 2 (two) times daily.    ROS:  Review of Systems  Constitutional:  Positive for chills, malaise/fatigue and weight loss. Negative for fever.  Respiratory:  Positive for cough. Negative for shortness of breath.   Cardiovascular:  Negative for chest pain.  Musculoskeletal:  Positive for back pain.  Neurological:  Positive for headaches. Negative for dizziness.    Objective:   Today's Vitals: BP 108/72   Pulse 68   Temp (!) 97.4 F (36.3 C) (Temporal)   Ht 6' (1.829 m)   Wt 179 lb (81.2 kg)   SpO2 96%   BMI 24.28 kg/m  Vitals with  BMI 02/19/2021 01/21/2021 01/15/2021  Height '6\' 0"'$  '6\' 0"'$  '6\' 0"'$   Weight 179 lbs 183 lbs 3 oz 184 lbs 3 oz  BMI 24.27 [id number]  Systolic 123XX123 [id number]  Diastolic 72 72 66  Pulse 68 76 71     Physical Exam Vitals reviewed.  Constitutional:      Appearance: Normal appearance.  HENT:     Head: Normocephalic and atraumatic.     Comments: (-) maxillary or frontal sinus tenderness    Right Ear: Hearing, ear canal and external ear normal. No tenderness. There is impacted cerumen.     Left Ear: Hearing, ear canal and external ear normal. No tenderness. There is impacted cerumen.  Cardiovascular:     Rate and Rhythm: Normal rate and regular rhythm.  Pulmonary:     Effort: Pulmonary effort is normal.     Breath sounds: Normal breath sounds.  Musculoskeletal:     Cervical back: Neck supple.   Lymphadenopathy:     Cervical: No cervical adenopathy.  Skin:    General: Skin is warm and dry.  Neurological:     Mental Status: He is alert and oriented to person, place, and time.  Psychiatric:        Mood and Affect: Mood normal.        Behavior: Behavior normal.        Thought Content: Thought content normal.        Judgment: Judgment normal.         Assessment and Plan   1. Acute non-recurrent frontal sinusitis      Plan: 1.  I think his symptoms most likely related to some sinusitis/allergic rhinorrhea.  Due to his age and comorbidities he is at risk for severe disease if he were to develop pneumonia so we will treat him empirically for sinusitis especially considering his symptoms have been prolonged for a few weeks with doxycycline.  Of also recommended he take a daily Allegra and use Flonase nasal spray.  He was also encouraged to use Tylenol as needed for headache or back pain.  Because our office is closing permanently in approximately 2 weeks I recommended referral to ENT just in case his symptoms persist and he needs additional follow-up, thus referral has been ordered.  He was encouraged to find a new primary care provider and to use urgent care as needed while waiting to be established with his new provider.  He tells me he understands.  We also discussed that if symptoms worsen especially if he starts to experience worsening fever, cough, shortness of breath, and/or pain that he needs to proceed to the emergency department or urgent care.  He tells me he understands.   Tests ordered Orders Placed This Encounter  Procedures   Ambulatory referral to ENT      Meds ordered this encounter  Medications   doxycycline (VIBRAMYCIN) 100 MG capsule    Sig: Take 1 capsule (100 mg total) by mouth 2 (two) times daily.    Dispense:  14 capsule    Refill:  0    Order Specific Question:   Supervising Provider    Answer:   Lindell Spar [id number]   fluticasone (FLONASE)  50 MCG/ACT nasal spray    Sig: Place 2 sprays into both nostrils daily.    Dispense:  16 g    Refill:  6    Order Specific Question:   Supervising Provider    Answer:   Lindell Spar [id number]  fexofenadine (ALLEGRA ALLERGY) 60 MG tablet    Sig: Take 1 tablet (60 mg total) by mouth daily.    Dispense:  30 tablet    Refill:  1    Order Specific Question:   Supervising Provider    Answer:   Lindell Spar [id number]   Patient not scheduled for follow-up as this office is closing permanently as of 03/11/21 due to the passing of Dr. Anastasio Champion.  The patient was notified of this and that they will need to find a new primary care provider.  They express understanding.   Ailene Ards, NP

## 2021-02-25 ENCOUNTER — Other Ambulatory Visit (INDEPENDENT_AMBULATORY_CARE_PROVIDER_SITE_OTHER): Payer: Self-pay

## 2021-02-25 DIAGNOSIS — M109 Gout, unspecified: Secondary | ICD-10-CM

## 2021-02-26 MED ORDER — ALLOPURINOL 100 MG PO TABS: 100.0000 mg | ORAL_TABLET | Freq: Every day | ORAL | 2 refills | Status: DC

## 2021-04-03 ENCOUNTER — Ambulatory Visit: Payer: Medicare PPO | Admitting: Nurse Practitioner

## 2021-05-14 ENCOUNTER — Ambulatory Visit: Payer: Medicare PPO | Admitting: Nurse Practitioner

## 2021-05-31 ENCOUNTER — Other Ambulatory Visit (INDEPENDENT_AMBULATORY_CARE_PROVIDER_SITE_OTHER): Payer: Self-pay | Admitting: Nurse Practitioner

## 2021-06-01 ENCOUNTER — Encounter (INDEPENDENT_AMBULATORY_CARE_PROVIDER_SITE_OTHER): Payer: Self-pay | Admitting: Ophthalmology

## 2021-06-03 ENCOUNTER — Other Ambulatory Visit: Payer: Self-pay

## 2021-06-03 ENCOUNTER — Ambulatory Visit
Admission: EM | Admit: 2021-06-03 | Discharge: 2021-06-03 | Disposition: A | Discharge status: Home / Self Care | Payer: Medicare PPO | Attending: Family Medicine | Admitting: Family Medicine

## 2021-06-03 ENCOUNTER — Encounter: Payer: Self-pay | Admitting: Emergency Medicine

## 2021-06-03 DIAGNOSIS — I10 Essential (primary) hypertension: Secondary | ICD-10-CM

## 2021-06-03 MED ORDER — LISINOPRIL-HYDROCHLOROTHIAZIDE 20-12.5 MG PO TABS: 1.0000 | ORAL_TABLET | Freq: Every day | ORAL | 1 refills | Status: DC

## 2021-06-03 NOTE — ED Triage Notes (Signed)
Patient presents for medication refill.   Patient needs lisinopril hydrochlorothiazide.   Patient has no complaints.

## 2021-06-03 NOTE — ED Notes (Signed)
Patient pulled as next in line. Registration error therefore time in epic is incorrect. Patient is next in line.

## 2021-06-03 NOTE — ED Provider Notes (Signed)
[facility]   [id number] 06/03/21 Arrival Time: 0913  ASSESSMENT & PLAN:  1. Essential hypertension     Meds ordered this encounter  Medications   lisinopril-hydrochlorothiazide (ZESTORETIC) 20-12.5 MG tablet    Sig: Take 1 tablet by mouth daily.    Dispense:  30 tablet    Refill:  1    Reviewed expectations re: course of current medical issues. Questions answered. Outlined signs and symptoms indicating need for more acute intervention. Patient verbalized understanding. After Visit Summary given.   SUBJECTIVE: History from: patient. Samuel Pope is a 80 y.o. male who presents requesting medication refill. No current concerns. Needs refill of lisin-HCTZ. BP controlled.  Current medical problems include: Past Medical History:  Diagnosis Date   Anesthesia of skin    Chronic obstructive pulmonary disease, unspecified (HCC)    Disorder of the skin and subcutaneous tissue, unspecified    Dizziness and giddiness    Essential (primary) hypertension    Hypercholesterolemia    Hyperlipidemia, unspecified    Hypertension    Kidney stones    Low back pain    Other chest pain    Prostate enlargement         Current Outpatient Medications (Cardiovascular):    lisinopril-hydrochlorothiazide (ZESTORETIC) 20-12.5 MG tablet, Take 1 tablet by mouth daily.   lisinopril-hydrochlorothiazide (ZESTORETIC) 20-12.5 MG tablet, Take 1 tablet by mouth daily.   Current Outpatient Medications (Respiratory):    fexofenadine (ALLEGRA ALLERGY) 60 MG tablet, Take 1 tablet (60 mg total) by mouth daily.   fluticasone (FLONASE) 50 MCG/ACT nasal spray, Place 2 sprays into both nostrils daily.   Current Outpatient Medications (Analgesics):    acetaminophen (TYLENOL) 500 MG tablet, Take 500 mg by mouth every 6 (six) hours as needed.   allopurinol (ZYLOPRIM) 100 MG tablet, Take 1 tablet (100 mg total) by mouth daily.   colchicine 0.6 MG tablet, Take 2 tablets by mouth once if needed  for acute gout attack, then if pain is still present in 1 hour take 1 additional tablet by mouth     Current Outpatient Medications (Other):    latanoprost (XALATAN) 0.005 % ophthalmic solution, Place 1 drop into both eyes at bedtime.   ofloxacin (OCUFLOX) 0.3 % ophthalmic solution, Place 1 drop into both eyes as directed.   triamcinolone cream (KENALOG) 0.1 %, Apply 1 application topically 2 (two) times daily. No current facility-administered medications for this encounter.      OBJECTIVE:  Vitals:   06/03/21 0925  BP: 128/83  Pulse: 76  Resp: 18  Temp: 97.6 F (36.4 C)  TempSrc: Oral  SpO2: 96%    General appearance: alert; no distress Psychological: alert and cooperative; normal mood and affect  Labs:  Labs Reviewed - No data to display  Allergies  Allergen Reactions   Penicillins Swelling    Did it involve swelling of the face/tongue/throat, SOB, or low BP? Yes Did it involve sudden or severe rash/hives, skin peeling, or any reaction on the inside of your mouth or nose? No Did you need to seek medical attention at a hospital or doctor's office? Yes When did it last happen? 15-20 years ago If all above answers are "NO", may proceed with cephalosporin use.     Social History   Socioeconomic History   Marital status: Widowed    Spouse name: Not on file   Number of children: Not on file   Years of education: Not on file   Highest education level: Not on file  Occupational History   Occupation: retired  Tobacco Use   Smoking status: Some Days    Packs/day: 1.00    Types: Cigarettes   Smokeless tobacco: Never  Vaping Use   Vaping Use: Never used  Substance and Sexual Activity   Alcohol use: Yes    Alcohol/week: 0.0 standard drinks    Comment: 1-2 beers a week   Drug use: No   Sexual activity: Not on file  Other Topics Concern   Not on file  Social History Narrative   Was married for 56 years.Wife died in 06/04/2020- Dementia.Retired.   Social  Determinants of Health   Financial Resource Strain: Not on file  Food Insecurity: Not on file  Transportation Needs: Not on file  Physical Activity: Not on file  Stress: Not on file  Social Connections: Not on file  Intimate Partner Violence: Not on file   Family History  Problem Relation Age of Onset   Colon cancer Paternal Uncle    Colon polyps Sister        "a lot of colon polyps"   Past Surgical History:  Procedure Laterality Date   COLONOSCOPY N/A 10/23/2015   Procedure: COLONOSCOPY;  Surgeon: Daneil Dolin, MD;  Location: [facility];  Service: Endoscopy;  Laterality: N/A;  0915   COLONOSCOPY N/A 06/13/2019   Procedure: COLONOSCOPY;  Surgeon: Daneil Dolin, MD;  Location: [facility];  Service: Endoscopy;  Laterality: N/A;  7:30am   POLYPECTOMY  06/13/2019   Procedure: POLYPECTOMY;  Surgeon: Daneil Dolin, MD;  Location: [facility];  Service: Endoscopy;;   TONSILECTOMY, ADENOIDECTOMY, BILATERAL MYRINGOTOMY AND TUBES     TRANSURETHRAL RESECTION OF PROSTATE        Vanessa Kick, MD 06/03/21 [phone]

## 2021-06-29 ENCOUNTER — Ambulatory Visit: Payer: Medicare PPO | Admitting: Nurse Practitioner

## 2021-07-17 ENCOUNTER — Encounter: Payer: Self-pay | Admitting: Nurse Practitioner

## 2021-07-17 ENCOUNTER — Other Ambulatory Visit: Payer: Self-pay

## 2021-07-17 ENCOUNTER — Ambulatory Visit (INDEPENDENT_AMBULATORY_CARE_PROVIDER_SITE_OTHER): Payer: Medicare PPO | Admitting: Nurse Practitioner

## 2021-07-17 VITALS — BP 101/70 | HR 85 | Ht 72.0 in | Wt 185.0 lb

## 2021-07-17 DIAGNOSIS — L989 Disorder of the skin and subcutaneous tissue, unspecified: Secondary | ICD-10-CM

## 2021-07-17 DIAGNOSIS — R7303 Prediabetes: Secondary | ICD-10-CM | POA: Diagnosis not present

## 2021-07-17 DIAGNOSIS — E785 Hyperlipidemia, unspecified: Secondary | ICD-10-CM | POA: Diagnosis not present

## 2021-07-17 DIAGNOSIS — M79671 Pain in right foot: Secondary | ICD-10-CM

## 2021-07-17 DIAGNOSIS — H402230 Chronic angle-closure glaucoma, bilateral, stage unspecified: Secondary | ICD-10-CM

## 2021-07-17 DIAGNOSIS — F17200 Nicotine dependence, unspecified, uncomplicated: Secondary | ICD-10-CM

## 2021-07-17 DIAGNOSIS — I10 Essential (primary) hypertension: Secondary | ICD-10-CM | POA: Diagnosis not present

## 2021-07-17 DIAGNOSIS — J449 Chronic obstructive pulmonary disease, unspecified: Secondary | ICD-10-CM

## 2021-07-17 DIAGNOSIS — H9193 Unspecified hearing loss, bilateral: Secondary | ICD-10-CM

## 2021-07-17 IMAGING — US US SOFT TISSUE HEAD/NECK
1 series · 9 of 9 positions shown · non-contrast
Comparison: None.

CLINICAL DATA: Right lower neck palpable nodule for several years,
nontender

EXAM:
ULTRASOUND OF HEAD/NECK SOFT TISSUES
TECHNIQUE: Ultrasound examination of the head and neck soft tissues was
performed in the area of clinical concern.

[Series 1: us soft tissue head & neck (non-thyroid) · 9 of 9 slices shown]
[im 1/9]
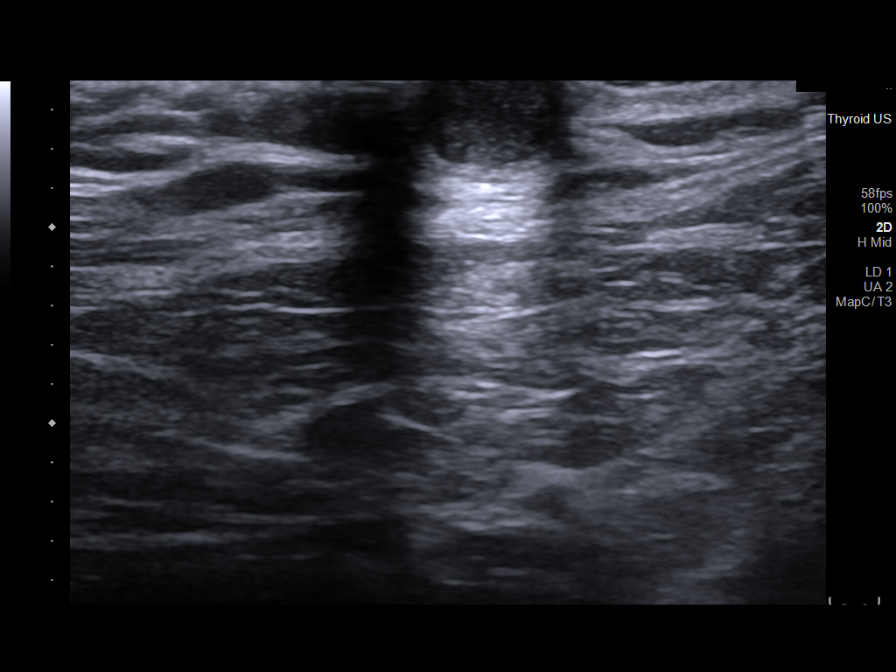
[im 2/9]
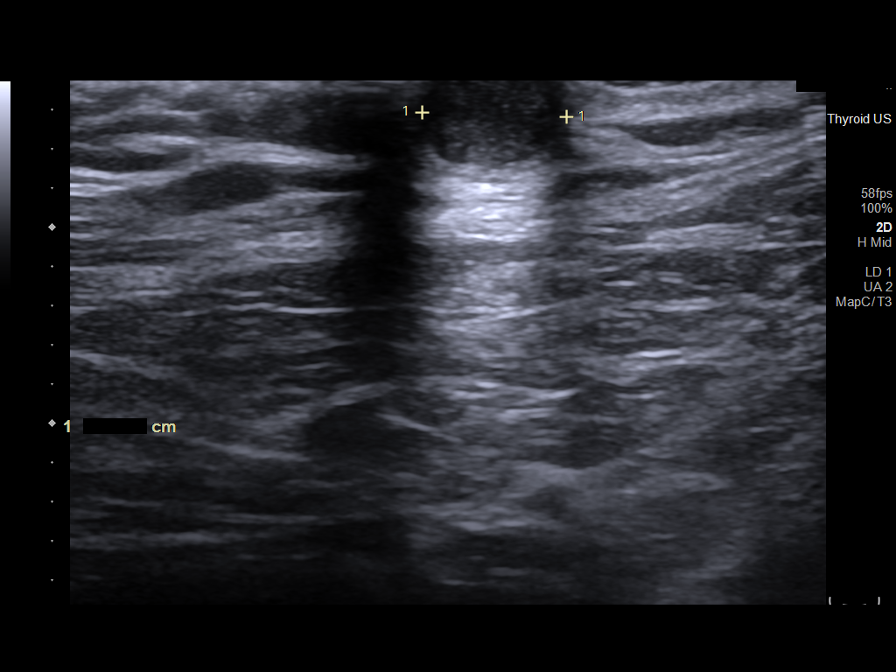
[im 3/9]
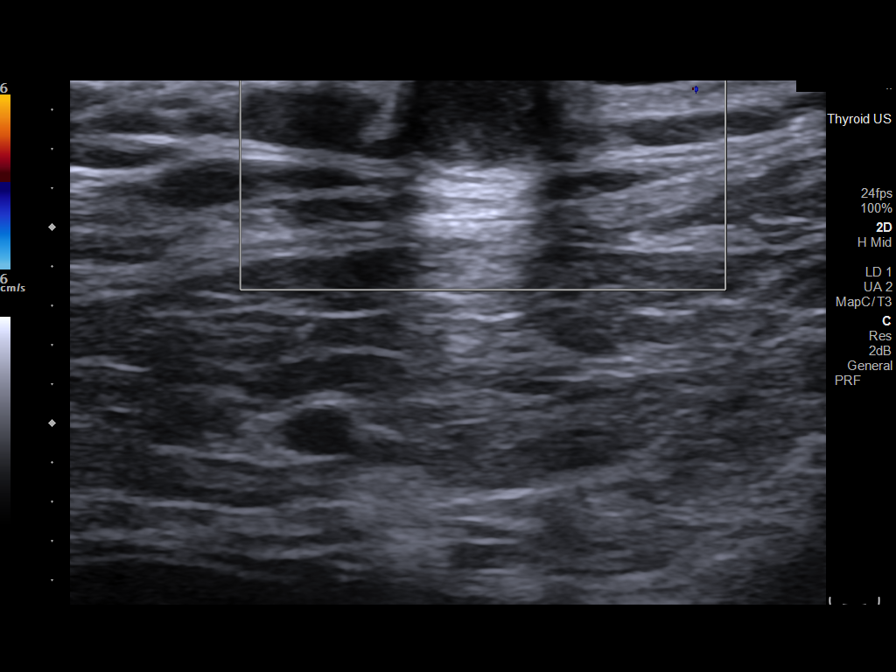
[im 4/9]
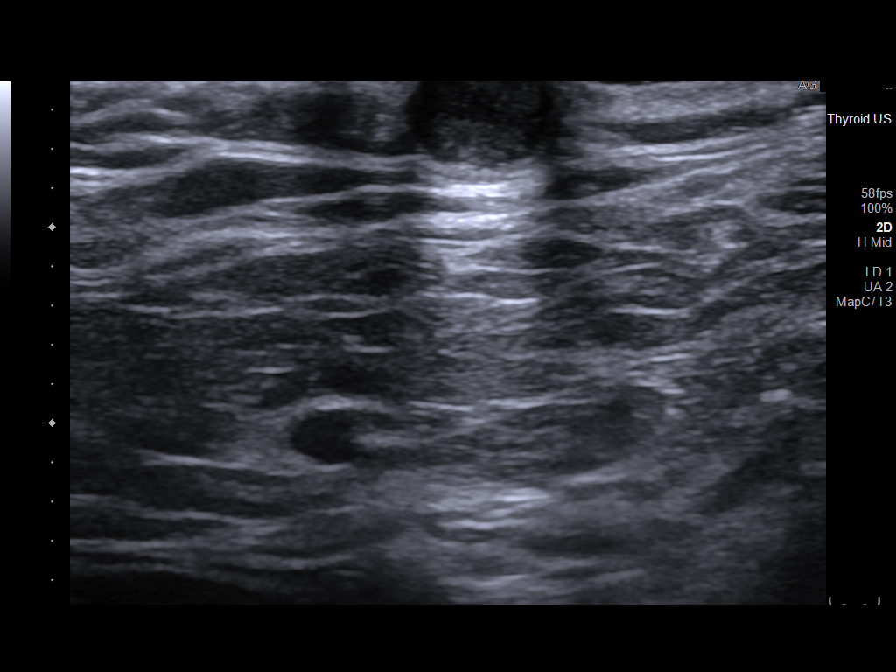
[im 5/9]
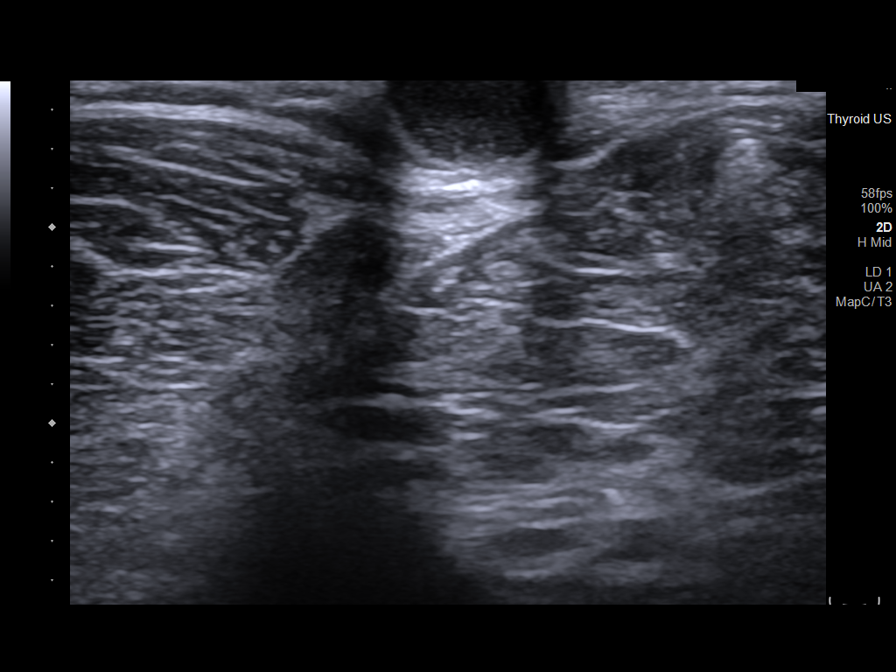
[im 6/9]
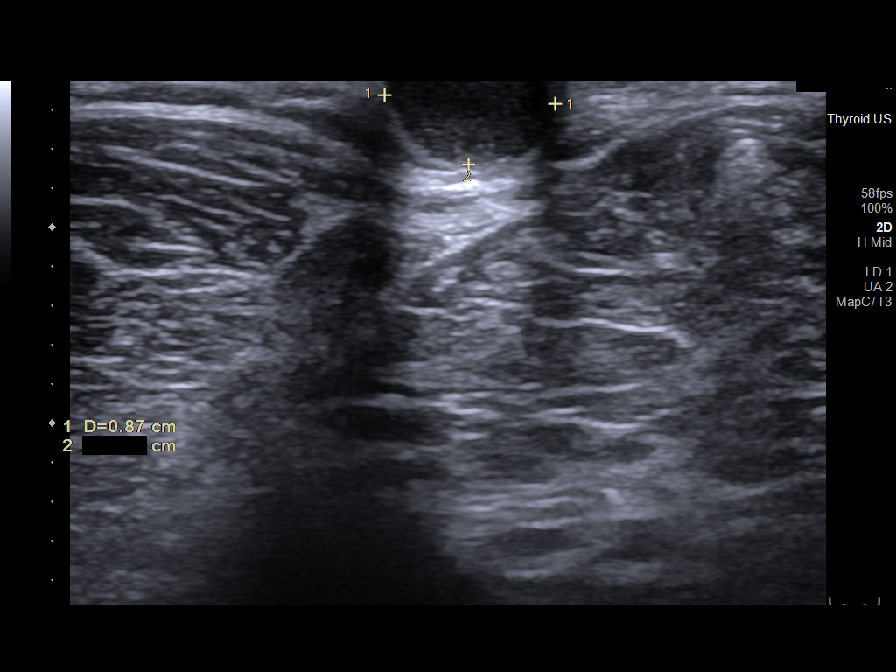
[im 7/9]
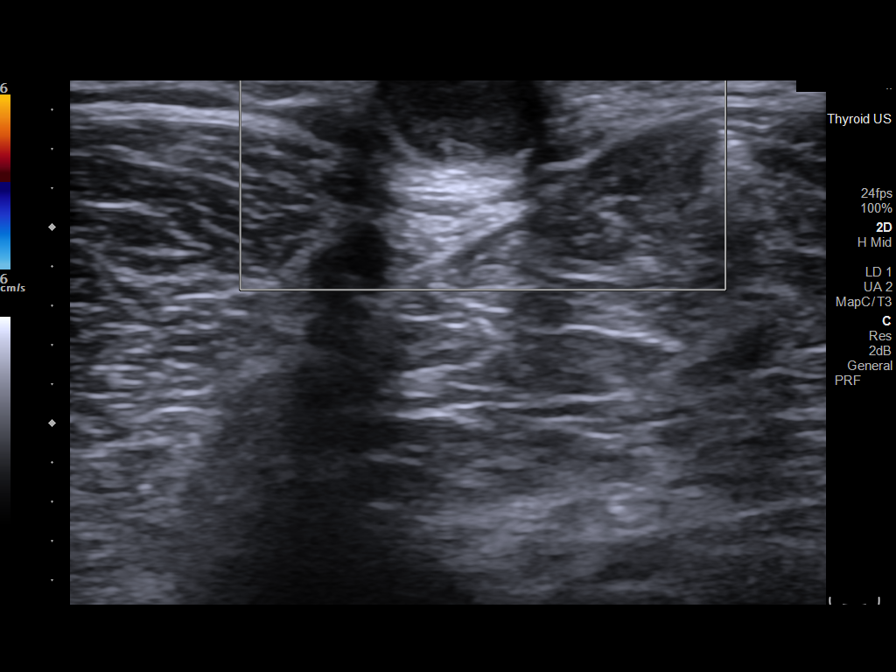
[im 8/9]
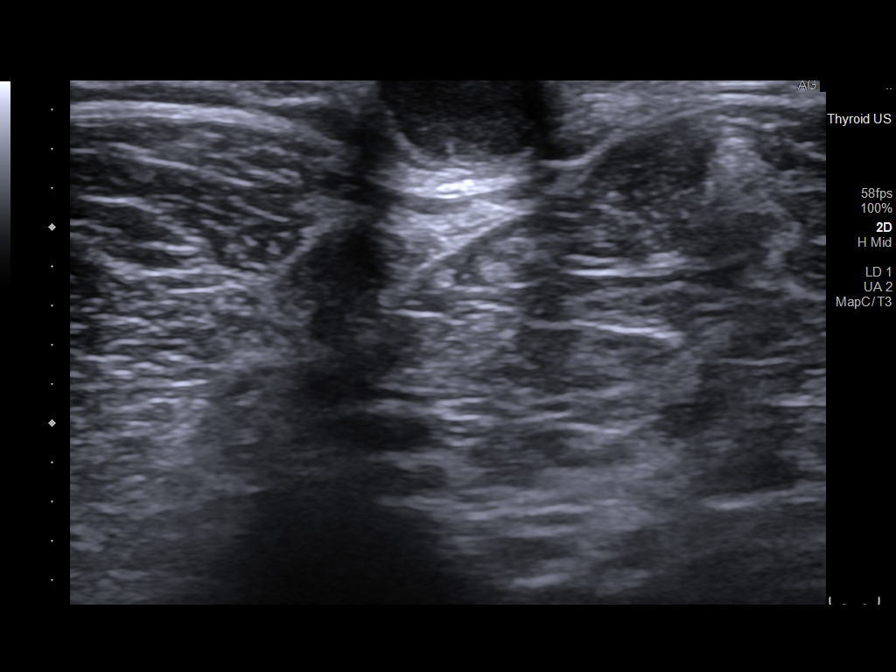
[im 9/9]
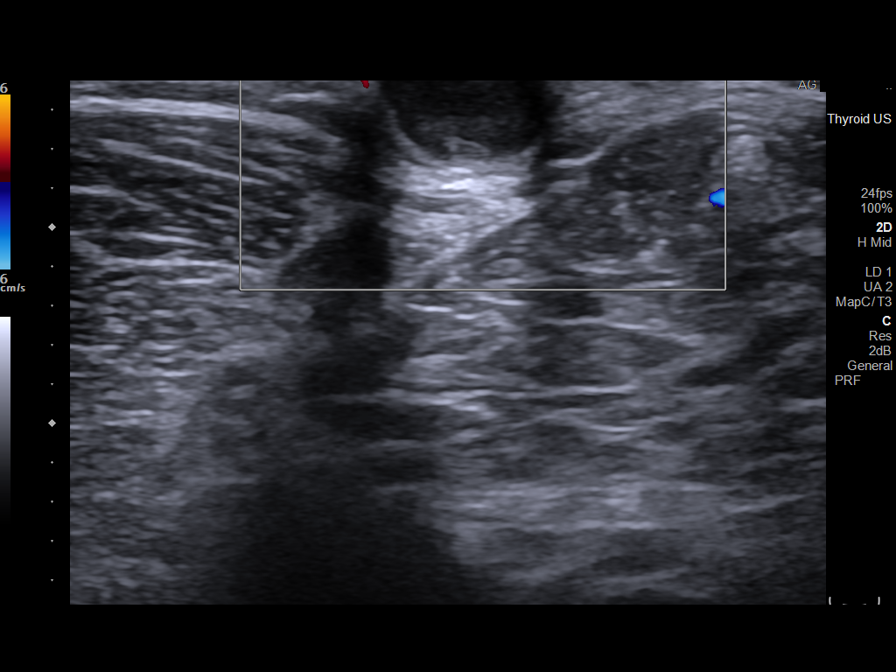

[9 of 9 positions shown; findings below may reference images not displayed]

FINDINGS: Ultrasound performed of the right base of neck palpable nodule/area
of concern. This correlates with a superficial well-circumscribed
hypoechoic nodule with increased through transmission and internal
echogenicity. This measures 9 x 6 x 7 mm. Abnormality is favored to
be a sebaceous or epidermal inclusion cyst. No other regional soft
tissue abnormality, fluid collection, hematoma, or abscess. No bulky
adenopathy.
IMPRESSION: 9 mm right base of neck subcutaneous hypoechoic cystic nodule
favored to be a sebaceous or epidermal inclusion cyst.

## 2021-07-17 MED ORDER — ALBUTEROL SULFATE HFA 108 (90 BASE) MCG/ACT IN AERS
2.0000 | INHALATION_SPRAY | Freq: Four times a day (QID) | RESPIRATORY_TRACT | 2 refills | Status: DC | PRN
Start: 2021-07-17 — End: 2023-09-19

## 2021-07-17 NOTE — Progress Notes (Signed)
° °  Samuel Pope     MRN: [id number]      DOB: June 07, 1941   HPI [name] is here to establish care . Previous PCP was Samuel Pope at Dr [facility] office.    Pt is due for shingles vaccine, COVID vaccine booster, zoster, pneumonia. Pt educated on the need to get required vaccinations, he verbalized understanding.    COPD: currently not taking any medication for this condition, continue to smoke 1 pack per day since age 81, he is having increased SOB, he has always been SOB, more sob when he is lying down He denies cough, wheezing, CP , palpitations.   HTN: Takes lisinopril-hydrochlorothiazide 20-12.5mg  tablet daily  HLD; currently not on medication. Pt states that he was previously on med but stopped taking med because he got better.   Prostate enlargement; not on medications. No complain today   Gout: Takes allopurinol 100mg  tablet, states that he his big toe is still hurting. He has been taking allopurinol 100mg  daily, he still have some colchicine at home.   Primary open angle glaucoma ; Followed by opthalmology, he states that he has been taking eye injections. 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 Denies sinus pressure, nasal congestion, ear pain or sore throat. Wears Hearing aids Denies chest congestion, productive cough or wheezing. Denies chest pains, palpitations and leg swelling Denies abdominal pain, nausea, vomiting,diarrhea or constipation.   Denies dysuria, frequency, hesitancy or incontinence Has right big toe  joint pain,right foot pain,  denies swelling and limitation in mobility. Denies headaches, seizures, numbness, or tingling. Denies depression, anxiety or insomnia. Denies skin break down or rash.   PE  BP 101/70    Pulse 85    Ht 6' (1.829 m)    Wt 185 lb 0.6 oz (83.9 kg)    SpO2 95%    BMI 25.10 kg/m   Patient alert and oriented and  in no cardiopulmonary distress.  HEENT: No facial asymmetry, EOMI,     Neck supple .  Chest: Clear to auscultation bilaterally.  CVS: S1, S2 no murmurs, no S3.Regular rate.  ABD: Soft non tender.   Ext: No edema  MS: Adequate ROM spine, shoulders, hips and knees.  Skin: Intact, no ulcerations or rash noted., skin lesion noted on on the medial aspect of right foot. Poor toe nail hygienes, toe nails are long and unkempt. 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 Memory intact not anxious or depressed appearing.  CNS: CN 2-12 intact, power,  normal throughout.no focal deficits noted.   Assessment & Plan

## 2021-07-17 NOTE — Patient Instructions (Signed)
Please get shingles vaccine, COVID vaccine booster, zoster, pneumonia vaccines at your pharmacy.  Use albuterol inhaler as needed for shortness of breath.   Please get your lab work done on Monday next week 07/20/21.    It is important that you exercise regularly at least 30 minutes 5 times a week.  Think about what you will eat, plan ahead. Choose " clean, green, fresh or frozen" over canned, processed or packaged foods which are more sugary, salty and fatty. 70 to 75% of food eaten should be vegetables and fruit. Three meals at set times with snacks allowed between meals, but they must be fruit or vegetables. Aim to eat over a 12 hour period , example 7 am to 7 pm, and STOP after  your last meal of the day. Drink water,generally about 64 ounces per day, no other drink is as healthy. Fruit juice is best enjoyed in a healthy way, by EATING the fruit.  Thanks for choosing [facility], we consider it a privelige to serve you.

## 2021-07-18 DIAGNOSIS — L989 Disorder of the skin and subcutaneous tissue, unspecified: Secondary | ICD-10-CM | POA: Insufficient documentation

## 2021-07-18 DIAGNOSIS — H409 Unspecified glaucoma: Secondary | ICD-10-CM | POA: Insufficient documentation

## 2021-07-18 DIAGNOSIS — F17200 Nicotine dependence, unspecified, uncomplicated: Secondary | ICD-10-CM | POA: Insufficient documentation

## 2021-07-18 DIAGNOSIS — H9193 Unspecified hearing loss, bilateral: Secondary | ICD-10-CM | POA: Insufficient documentation

## 2021-07-18 DIAGNOSIS — M109 Gout, unspecified: Secondary | ICD-10-CM | POA: Insufficient documentation

## 2021-07-18 DIAGNOSIS — M79671 Pain in right foot: Secondary | ICD-10-CM | POA: Insufficient documentation

## 2021-07-18 NOTE — Assessment & Plan Note (Signed)
urrently not taking any medication for this condition, continue to smoke 1 pack per day since age 81, he is having increased SOB, he has always been SOB, more sob when he is lying down He denies cough, wheezing, CP , palpitations Pt counseled on smoking cessation, he states that he is not ready to quit smoking.

## 2021-07-18 NOTE — Assessment & Plan Note (Signed)
Followed by opthalmology, he states that he has been taking eye injections. He takes ofloxacin ophthalmic solution, latanoprost ophthalmic solution

## 2021-07-18 NOTE — Assessment & Plan Note (Signed)
Start albuterol in haler as needed. currently not taking any medication for this condition, continue to smoke 1 pack per day since age 81, he is having increased SOB, he has always been SOB, more sob when he is lying down He denies cough, wheezing, CP , palpitations.

## 2021-07-18 NOTE — Assessment & Plan Note (Signed)
currently not on medication. Pt states that he was previously on med but stopped taking med because he got better. Lipid panel today

## 2021-07-18 NOTE — Assessment & Plan Note (Signed)
Takes allopurinol for gout, has a skin lesion on right foot,bilateral foot poor toe nail hygiene , refer to podiatry.

## 2021-07-18 NOTE — Assessment & Plan Note (Signed)
Continue allopurinol 100mg  . Check uric acid level. Refer to podiatry.

## 2021-07-18 NOTE — Assessment & Plan Note (Signed)
Lab Results  Component Value Date   HGBA1C 5.8 (H) 07/17/2020  A1C today

## 2021-07-18 NOTE — Assessment & Plan Note (Signed)
Wears bilateral hearing aids 

## 2021-07-18 NOTE — Assessment & Plan Note (Signed)
Right foot skin lesion, refer to podiatry

## 2021-07-18 NOTE — Assessment & Plan Note (Signed)
DASH diet and commitment to daily physical activity for a minimum of 30 minutes discussed and encouraged, as a part of hypertension management. The importance of attaining a healthy weight is also discussed.  BP/Weight 07/17/2021 06/03/2021 02/19/2021 01/21/2021 01/15/2021 0/13/1438 02/16/7578  Systolic BP 728 206 015 615 379 432 761  Diastolic BP 70 83 72 72 66 80 75  Wt. (Lbs) 185.04 - 179 183.2 184.2 180.2 -  BMI 25.1 - 24.28 24.85 24.98 24.44 -   Continue Zestoretic 20-12.75m daily EGFR today

## 2021-07-21 ENCOUNTER — Other Ambulatory Visit: Payer: Self-pay

## 2021-07-21 ENCOUNTER — Ambulatory Visit (INDEPENDENT_AMBULATORY_CARE_PROVIDER_SITE_OTHER): Payer: Medicare PPO

## 2021-07-21 DIAGNOSIS — Z0000 Encounter for general adult medical examination without abnormal findings: Secondary | ICD-10-CM | POA: Diagnosis not present

## 2021-07-21 LAB — CBC
Hematocrit: 44.3 % (ref 37.5–51.0)
Hemoglobin: 14.9 g/dL (ref 13.0–17.7)
MCH: 31.6 pg (ref 26.6–33.0)
MCHC: 33.6 g/dL (ref 31.5–35.7)
MCV: 94 fL (ref 79–97)
Platelets: 242 10*3/uL (ref 150–450)
RBC: 4.71 x10E6/uL (ref 4.14–5.80)
RDW: 11.8 % (ref 11.6–15.4)
WBC: 5.1 10*3/uL (ref 3.4–10.8)

## 2021-07-21 LAB — TSH: TSH: 1.84 u[IU]/mL (ref 0.450–4.500)

## 2021-07-21 LAB — CMP14+EGFR
ALT: 17 IU/L (ref 0–44)
AST: 18 IU/L (ref 0–40)
Albumin/Globulin Ratio: 1.8 (ref 1.2–2.2)
Albumin: 4.7 g/dL — ABNORMAL HIGH (ref 3.6–4.6)
Alkaline Phosphatase: 81 IU/L (ref 44–121)
BUN/Creatinine Ratio: 20 (ref 10–24)
BUN: 24 mg/dL (ref 8–27)
Bilirubin Total: 0.4 mg/dL (ref 0.0–1.2)
CO2: 26 mmol/L (ref 20–29)
Calcium: 9.6 mg/dL (ref 8.6–10.2)
Chloride: 102 mmol/L (ref 96–106)
Creatinine, Ser: 1.23 mg/dL (ref 0.76–1.27)
Globulin, Total: 2.6 g/dL (ref 1.5–4.5)
Glucose: 101 mg/dL — ABNORMAL HIGH (ref 70–99)
Potassium: 4.9 mmol/L (ref 3.5–5.2)
Sodium: 140 mmol/L (ref 134–144)
Total Protein: 7.3 g/dL (ref 6.0–8.5)
eGFR: 59 mL/min/{1.73_m2} — ABNORMAL LOW (ref >59)

## 2021-07-21 LAB — LIPID PANEL
Chol/HDL Ratio: 5.1 ratio — ABNORMAL HIGH (ref 0.0–5.0)
Cholesterol, Total: 204 mg/dL — ABNORMAL HIGH (ref 100–199)
HDL: 40 mg/dL (ref >39)
LDL Chol Calc (NIH): 136 mg/dL — ABNORMAL HIGH (ref 0–99)
Triglycerides: 154 mg/dL — ABNORMAL HIGH (ref 0–149)
VLDL Cholesterol Cal: 28 mg/dL (ref 5–40)

## 2021-07-21 LAB — HEMOGLOBIN A1C
Est. average glucose Bld gHb Est-mCnc: 128 mg/dL
Hgb A1c MFr Bld: 6.1 % — ABNORMAL HIGH (ref 4.8–5.6)

## 2021-07-21 LAB — URIC ACID: Uric Acid: 6.9 mg/dL (ref 3.8–8.4)

## 2021-07-21 NOTE — Patient Instructions (Addendum)
Samuel Pope , Thank you for taking time to come for your Medicare Wellness Visit. I appreciate your ongoing commitment to your health goals. Please review the following plan we discussed and let me know if I can assist you in the future.   These are the goals we discussed:  Goals   None     This is a list of the screening recommended for you and due dates:  Health Maintenance  Topic Date Due   Pneumonia Vaccine (1 - PCV) Never done   Zoster (Shingles) Vaccine (1 of 2) Never done   COVID-19 Vaccine (3 - Booster for Moderna series) 08/02/2021*   Flu Shot  10/09/2021*   Tetanus Vaccine  11/21/2029   HPV Vaccine  Aged Out  *Topic was postponed. The date shown is not the original due date.    Health Maintenance, Male Adopting a healthy lifestyle and getting preventive care are important in promoting health and wellness. Ask your health care provider about: The right schedule for you to have regular tests and exams. Things you can do on your own to prevent diseases and keep yourself healthy. What should I know about diet, weight, and exercise? Eat a healthy diet  Eat a diet that includes plenty of vegetables, fruits, low-fat dairy products, and lean protein. Do not eat a lot of foods that are high in solid fats, added sugars, or sodium. Maintain a healthy weight Body mass index (BMI) is a measurement that can be used to identify possible weight problems. It estimates body fat based on height and weight. Your health care provider can help determine your BMI and help you achieve or maintain a healthy weight. Get regular exercise Get regular exercise. This is one of the most important things you can do for your health. Most adults should: Exercise for at least 150 minutes each week. The exercise should increase your heart rate and make you sweat (moderate-intensity exercise). Do strengthening exercises at least twice a week. This is in addition to the moderate-intensity exercise. Spend less  time sitting. Even light physical activity can be beneficial. Watch cholesterol and blood lipids Have your blood tested for lipids and cholesterol at 83 years of age, then have this test every 5 years. You may need to have your cholesterol levels checked more often if: Your lipid or cholesterol levels are high. You are older than 83 years of age. You are at high risk for heart disease. What should I know about cancer screening? Many types of cancers can be detected early and may often be prevented. Depending on your health history and family history, you may need to have cancer screening at various ages. This may include screening for: Colorectal cancer. Prostate cancer. Skin cancer. Lung cancer. What should I know about heart disease, diabetes, and high blood pressure? Blood pressure and heart disease High blood pressure causes heart disease and increases the risk of stroke. This is more likely to develop in people who have high blood pressure readings or are overweight. Talk with your health care provider about your target blood pressure readings. Have your blood pressure checked: Every 3-5 years if you are 95-56 years of age. Every year if you are 16 years old or older. If you are between the ages of 67 and 30 and are a current or former smoker, ask your health care provider if you should have a one-time screening for abdominal aortic aneurysm (AAA). Diabetes Have regular diabetes screenings. This checks your fasting blood sugar level. Have  the screening done: Once every three years after age 68 if you are at a normal weight and have a low risk for diabetes. More often and at a younger age if you are overweight or have a high risk for diabetes. What should I know about preventing infection? Hepatitis B If you have a higher risk for hepatitis B, you should be screened for this virus. Talk with your health care provider to find out if you are at risk for hepatitis B infection. Hepatitis  C Blood testing is recommended for: Everyone born from [birth year] through 1965. Anyone with known risk factors for hepatitis C. Sexually transmitted infections (STIs) You should be screened each year for STIs, including gonorrhea and chlamydia, if: You are sexually active and are younger than 83 years of age. You are older than 83 years of age and your health care provider tells you that you are at risk for this type of infection. Your sexual activity has changed since you were last screened, and you are at increased risk for chlamydia or gonorrhea. Ask your health care provider if you are at risk. Ask your health care provider about whether you are at high risk for HIV. Your health care provider may recommend a prescription medicine to help prevent HIV infection. If you choose to take medicine to prevent HIV, you should first get tested for HIV. You should then be tested every 3 months for as long as you are taking the medicine. Follow these instructions at home: Alcohol use Do not drink alcohol if your health care provider tells you not to drink. If you drink alcohol: Limit how much you have to 0-2 drinks a day. Know how much alcohol is in your drink. In the [place] one drink equals one 12 oz bottle of beer (355 mL), one 5 oz glass of wine (148 mL), or one 1 oz glass of hard liquor (44 mL). Lifestyle Do not use any products that contain nicotine or tobacco. These products include cigarettes, chewing tobacco, and vaping devices, such as e-cigarettes. If you need help quitting, ask your health care provider. Do not use street drugs. Do not share needles. Ask your health care provider for help if you need support or information about quitting drugs. General instructions Schedule regular health, dental, and eye exams. Stay current with your vaccines. Tell your health care provider if: You often feel depressed. You have ever been abused or do not feel safe at home. Summary Adopting a healthy  lifestyle and getting preventive care are important in promoting health and wellness. Follow your health care provider's instructions about healthy diet, exercising, and getting tested or screened for diseases. Follow your health care provider's instructions on monitoring your cholesterol and blood pressure. This information is not intended to replace advice given to you by your health care provider. Make sure you discuss any questions you have with your health care provider. Document Revised: 11/17/2020 Document Reviewed: 11/17/2020 Elsevier Patient Education  [place].

## 2021-07-21 NOTE — Progress Notes (Signed)
Subjective:   Samuel Pope is a 81 y.o. male who presents for an Initial Medicare Annual Wellness Visit. I connected with  Samuel Pope on 07/21/21 by a audio enabled telemedicine application and verified that I am speaking with the correct person using two identifiers.  Patient Location: Home  Provider Location: Office/Clinic  I discussed the limitations of evaluation and management by telemedicine. The patient expressed understanding and agreed to proceed.  Review of Systems    Defer to PCP Cardiac Risk Factors include: advanced age (>72men, >91 women);hypertension;male gender     Objective:    Today's Vitals   07/21/21 1447  PainSc: 1    There is no height or weight on file to calculate BMI.  Advanced Directives 07/21/2021 06/13/2019 10/23/2015  Does Patient Have a Medical Advance Directive? No No No  Would patient like information on creating a medical advance directive? No - Patient declined No - Patient declined No - patient declined information    Current Medications (verified) Outpatient Encounter Medications as of 07/21/2021  Medication Sig   acetaminophen (TYLENOL) 500 MG tablet Take 500 mg by mouth every 6 (six) hours as needed.   allopurinol (ZYLOPRIM) 100 MG tablet Take 1 tablet (100 mg total) by mouth daily.   fluticasone (FLONASE) 50 MCG/ACT nasal spray Place 2 sprays into both nostrils daily.   latanoprost (XALATAN) 0.005 % ophthalmic solution Place 1 drop into both eyes at bedtime.   lisinopril-hydrochlorothiazide (ZESTORETIC) 20-12.5 MG tablet Take 1 tablet by mouth daily.   ofloxacin (OCUFLOX) 0.3 % ophthalmic solution Place 1 drop into both eyes as directed.   albuterol (VENTOLIN HFA) 108 (90 Base) MCG/ACT inhaler Inhale 2 puffs into the lungs every 6 (six) hours as needed for wheezing or shortness of breath. (Patient not taking: Reported on 07/21/2021)   colchicine 0.6 MG tablet Take 2 tablets by mouth once if needed for acute gout attack, then if pain is  still present in 1 hour take 1 additional tablet by mouth (Patient not taking: Reported on 07/17/2021)   No facility-administered encounter medications on file as of 07/21/2021.    Allergies (verified) Penicillins   History: Past Medical History:  Diagnosis Date   Anesthesia of skin    Chronic obstructive pulmonary disease, unspecified (HCC)    Disorder of the skin and subcutaneous tissue, unspecified    Dizziness and giddiness    Essential (primary) hypertension    Hypercholesterolemia    Hyperlipidemia, unspecified    Hypertension    Kidney stones    Low back pain    Other chest pain    Prostate enlargement    Past Surgical History:  Procedure Laterality Date   COLONOSCOPY N/A 10/23/2015   Procedure: COLONOSCOPY;  Surgeon: Daneil Dolin, MD;  Location: [facility];  Service: Endoscopy;  Laterality: N/A;  0915   COLONOSCOPY N/A 06/13/2019   Procedure: COLONOSCOPY;  Surgeon: Daneil Dolin, MD;  Location: [facility];  Service: Endoscopy;  Laterality: N/A;  7:30am   POLYPECTOMY  06/13/2019   Procedure: POLYPECTOMY;  Surgeon: Daneil Dolin, MD;  Location: [facility];  Service: Endoscopy;;   TONSILECTOMY, ADENOIDECTOMY, BILATERAL MYRINGOTOMY AND TUBES     TRANSURETHRAL RESECTION OF PROSTATE     Family History  Problem Relation Age of Onset   Heart disease Father    Colon polyps Sister        "a lot of colon polyps"   Colon cancer Brother    Prostate cancer Brother  Colon cancer Paternal Uncle    Social History   Socioeconomic History   Marital status: Widowed    Spouse name: Not on file   Number of children: Not on file   Years of education: Not on file   Highest education level: Not on file  Occupational History   Occupation: retired  Tobacco Use   Smoking status: Some Days    Packs/day: 1.00    Types: Cigarettes   Smokeless tobacco: Never   Tobacco comments:    Started smoking at age 81, he smokes one pack a day. 07/17/2021. Pt is not ready to quit.    Vaping Use   Vaping Use: Never used  Substance and Sexual Activity   Alcohol use: Not Currently    Comment: 1-2 beers a week   Drug use: No   Sexual activity: Not Currently  Other Topics Concern   Not on file  Social History Narrative   Was married for 56 years.Wife died in Jun 11, 2020- Dementia.Retired.   Lives home alone now.    Social Determinants of Health   Financial Resource Strain: Low Risk    Difficulty of Paying Living Expenses: Not hard at all  Food Insecurity: No Food Insecurity   Worried About Charity fundraiser in the Last Year: Never true   [place] in the Last Year: Never true  Transportation Needs: No Transportation Needs   Lack of Transportation (Medical): No   Lack of Transportation (Non-Medical): No  Physical Activity: Sufficiently Active   Days of Exercise per Week: 5 days   Minutes of Exercise per Session: 60 min  Stress: No Stress Concern Present   Feeling of Stress : Only a little  Social Connections: Moderately Integrated   Frequency of Communication with Friends and Family: More than three times a week   Frequency of Social Gatherings with Friends and Family: Once a week   Attends Religious Services: 1 to 4 times per year   Active Member of [employer] or Organizations: Yes   Attends Archivist Meetings: Never   Marital Status: Widowed    Tobacco Counseling Ready to quit: Not Answered Counseling given: Not Answered Tobacco comments: Started smoking at age 54, he smokes one pack a day. 07/17/2021. Pt is not ready to quit.    Clinical Intake:  Pre-visit preparation completed: No  Pain : 0-10 Pain Score: 1  Pain Location: Eye Pain Orientation: Left Pain Onset: Today     Nutritional Risks: None Diabetes: No  How often do you need to have someone help you when you read instructions, pamphlets, or other written materials from your doctor or pharmacy?: 3 - Sometimes What is the last grade level you completed in school?:  12  Diabetic?no  Interpreter Needed?: No  Information entered by :: [place] of Daily Living In your present state of health, do you have any difficulty performing the following activities: 07/21/2021  Hearing? Y  Vision? (No Data)  Comment does n ot wear glasses  Difficulty concentrating or making decisions? N  Walking or climbing stairs? N  Dressing or bathing? N  Doing errands, shopping? Y  Preparing Food and eating ? N  Using the Toilet? N  In the past six months, have you accidently leaked urine? N  Do you have problems with loss of bowel control? N  Managing your Medications? N  Managing your Finances? N  Housekeeping or managing your Housekeeping? N  Some recent data might be hidden  Patient Care Team: Renee Rival, FNP as PCP - General (Nurse Practitioner) Daneil Dolin, MD as Consulting Physician (Gastroenterology)  Indicate any recent Medical Services you may have received from other than [facility] providers in the past year (date may be approximate).     Assessment:   This is a routine wellness examination for [place].  Hearing/Vision screen No results found.  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 0 - 0 0 0  Number falls in past yr: 0 - - - 0  Injury with Fall? 0 - - - 0  Risk for fall due to : No Fall Risks No Fall Risks - - No Fall Risks  Follow up Falls evaluation completed Falls evaluation  completed - - Falls evaluation completed    [place]:  Any stairs in or around the home? Yes  If so, are there any without handrails? Yes  Home free of loose throw rugs in walkways, pet beds, electrical cords, etc? Yes  Adequate lighting in your home to reduce risk of falls? Yes   ASSISTIVE DEVICES UTILIZED TO PREVENT FALLS:  Life alert? No  Use of a cane, walker or w/c? No  Grab bars in the bathroom? No  Shower chair or bench in shower? No  Elevated toilet seat or a handicapped toilet? No     Cognitive Function:     6CIT Screen 07/21/2021  What Year? 0 points  What month? 0 points  What time? 0 points  Count back from 20 0 points  Months in reverse 4 points  Repeat phrase 0 points  Total Score 4    Immunizations Immunization History  Administered Date(s) Administered   Moderna Sars-Covid-2 Vaccination 08/31/2019, 09/28/2019   Tdap 11/22/2019    TDAP status: Up to date  Flu Vaccine status: Due, Education has been provided regarding the importance of this vaccine. Advised may receive this vaccine at local pharmacy or Health Dept. Aware to provide a copy of the vaccination record if obtained from local pharmacy or Health Dept. Verbalized acceptance and understanding.  Pneumococcal vaccine status: Due, Education has been provided regarding the importance of this vaccine. Advised may receive this vaccine at local pharmacy or Health Dept. Aware to provide a copy of the vaccination record if obtained from local pharmacy or Health Dept. Verbalized acceptance and understanding.  Covid-19 vaccine status: Information provided on how to obtain vaccines.   Qualifies for Shingles Vaccine? Yes   Zostavax completed No   Shingrix Completed?: No.    Education has been provided regarding the importance of this vaccine. Patient has been advised to call insurance company to determine out of pocket expense if they have not yet received this vaccine.  Advised may also receive vaccine at local pharmacy or Health Dept. Verbalized acceptance and understanding.  Screening Tests Health Maintenance  Topic Date Due   Pneumonia Vaccine 51+ Years old (1 - PCV)  Never done   Zoster Vaccines- Shingrix (1 of 2) Never done   COVID-19 Vaccine (3 - Booster for Moderna series) 08/02/2021 (Originally 11/23/2019)   INFLUENZA VACCINE  10/09/2021 (Originally 02/09/2021)   TETANUS/TDAP  11/21/2029   HPV VACCINES  Aged Out    Health Maintenance  Health Maintenance Due  Topic Date Due   Pneumonia Vaccine 15+ Years old (1 - PCV) Never done   Zoster Vaccines- Shingrix (1 of 2) Never done    Colorectal cancer screening: No longer required.   Lung Cancer Screening: (Low Dose CT Chest recommended if Age 38-80 years, 30 pack-year currently smoking OR have quit w/in 15years.) does not qualify.   Lung Cancer Screening Referral: n/a  Additional Screening:  Hepatitis C Screening: does not qualify; Completed not at high risk for Hep C  Vision Screening: Recommended annual ophthalmology exams for early detection of glaucoma and other disorders of the eye. Is the patient up to date with their annual eye exam?  Yes  Who is the provider or what is the name of the office in which the patient attends annual eye exams? [facility] If pt is not established with a provider, would they like to be referred to a provider to establish care? No .   Dental Screening: Recommended annual dental exams for proper oral hygiene  Community Resource Referral / Chronic Care Management: CRR required this visit?  No   CCM required this visit?  No      Plan:     I have personally reviewed and noted the following in the patients chart:   Medical and social history Use of alcohol, tobacco or illicit drugs  Current medications and supplements including opioid prescriptions. Patient is not currently taking opioid prescriptions. Functional ability and status Nutritional  status Physical activity Advanced directives List of other physicians Hospitalizations, surgeries, and ER visits in previous 12 months Vitals Screenings to include cognitive, depression, and falls Referrals and appointments  In addition, I have reviewed and discussed with patient certain preventive protocols, quality metrics, and best practice recommendations. A written personalized care plan for preventive services as well as general preventive health recommendations were provided to patient.     Earline [place], [place]   07/21/2021   Nurse Notes:  Mr. Fuhrmann , Thank you for taking time to come for your Medicare Wellness Visit. I appreciate your ongoing commitment to your health goals. Please review the following plan we discussed and let me know if I can assist you in the future.   These are the goals we discussed:  Goals   None     This is a list of the screening recommended for you and due dates:  Health Maintenance  Topic Date Due   Pneumonia Vaccine (1 - PCV) Never done   Zoster (Shingles) Vaccine (1 of 2) Never done   COVID-19 Vaccine (3 - Booster for Moderna series) 08/02/2021*   Flu Shot  10/09/2021*   Tetanus Vaccine  11/21/2029   HPV Vaccine  Aged Out  *Topic was postponed. The date shown is not the original due date.

## 2021-07-22 ENCOUNTER — Other Ambulatory Visit: Payer: Self-pay | Admitting: Nurse Practitioner

## 2021-07-22 DIAGNOSIS — I10 Essential (primary) hypertension: Secondary | ICD-10-CM

## 2021-07-22 DIAGNOSIS — E785 Hyperlipidemia, unspecified: Secondary | ICD-10-CM

## 2021-07-25 IMAGING — US US EXTREM LOW VENOUS*L*
1 series · 13 of 24 positions shown · non-contrast
Comparison: None.

CLINICAL DATA: 80-year-old male with a history of possible DVT



[Series 1: us venous img lower uni left (dvt) · portal-venous · 13 of 42 slices shown]
[im 1/42]
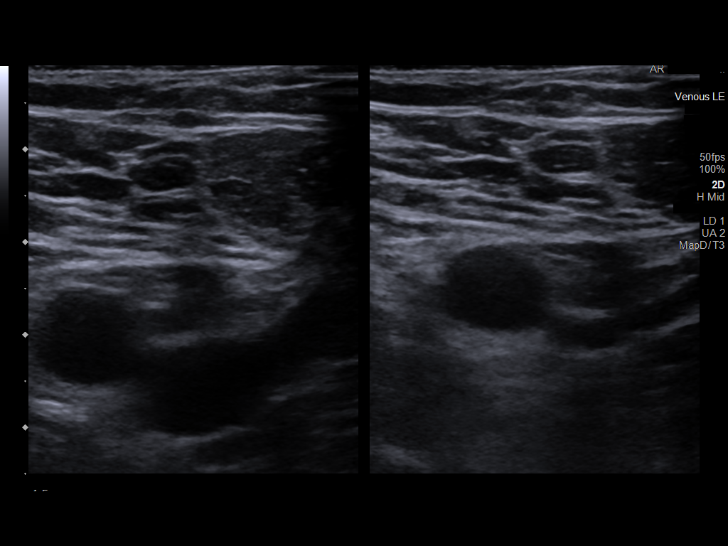
[im 4/42]
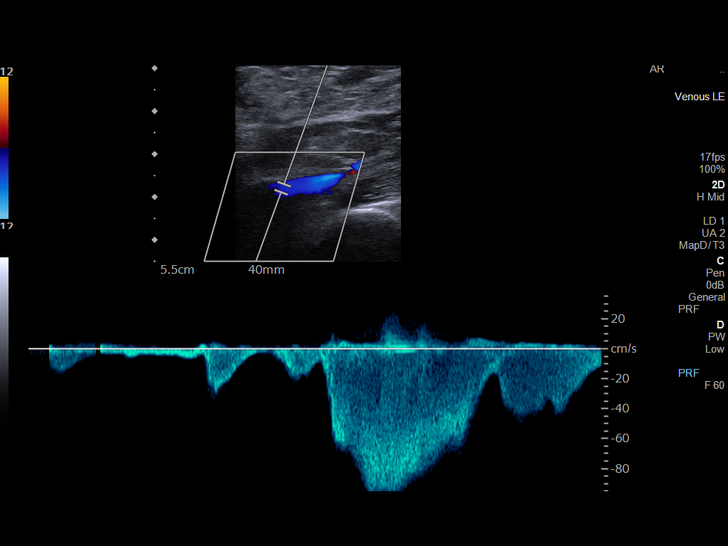
[im 8/42]
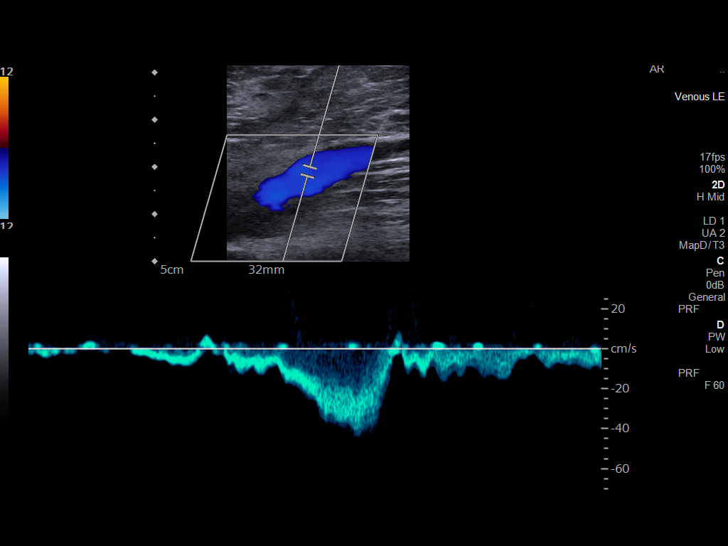
[im 11/42]
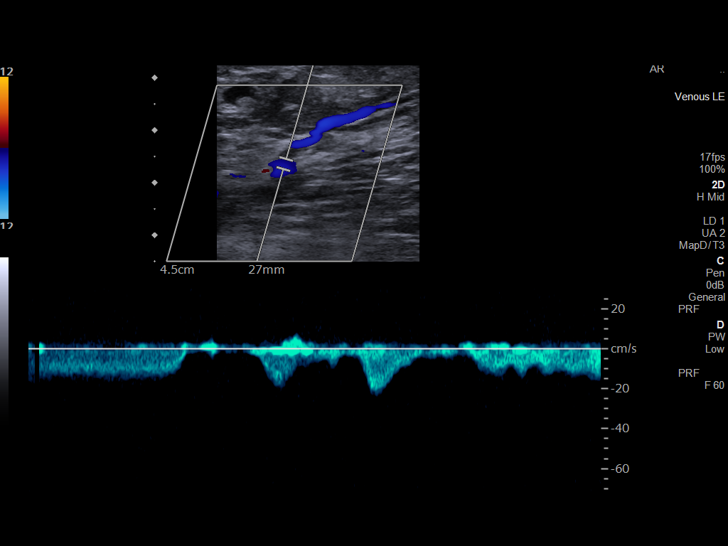
[im 15/42]
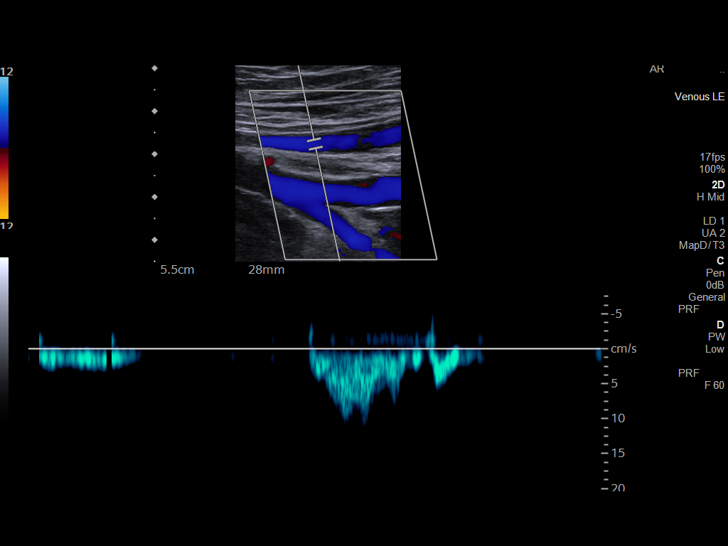
[im 18/42]
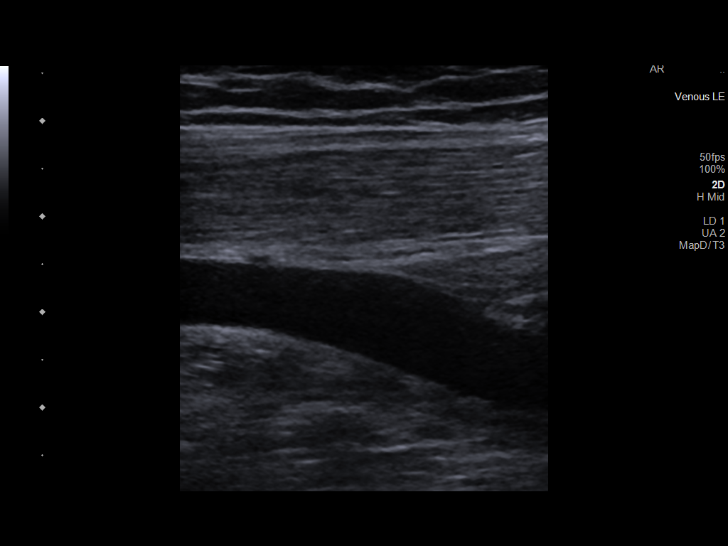
[im 22/42]
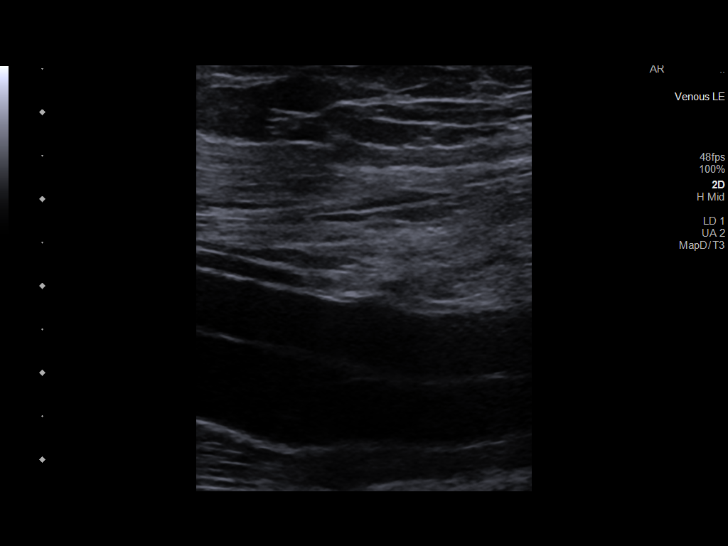
[im 24/42]
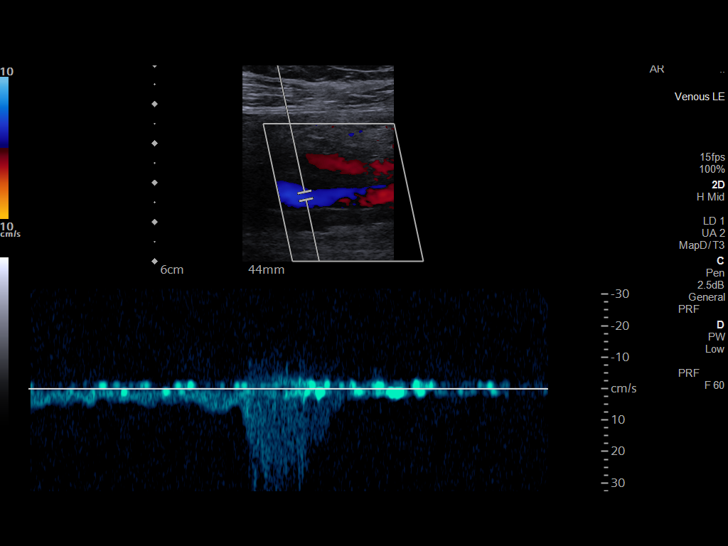
[im 27/42]
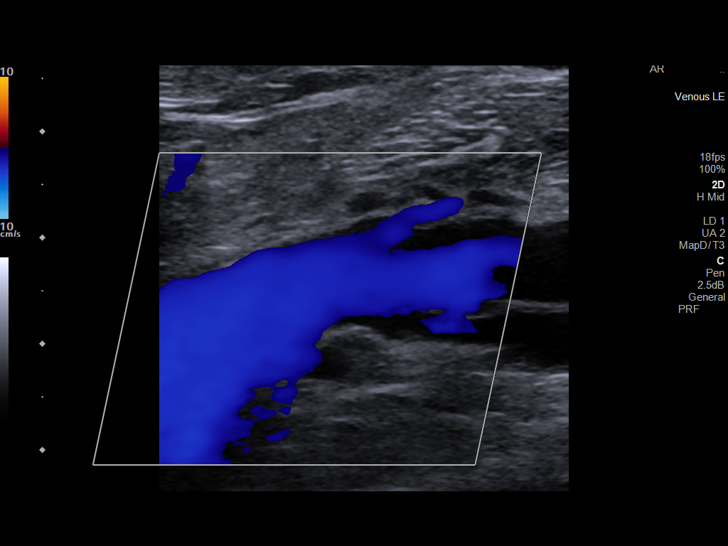
[im 31/42]
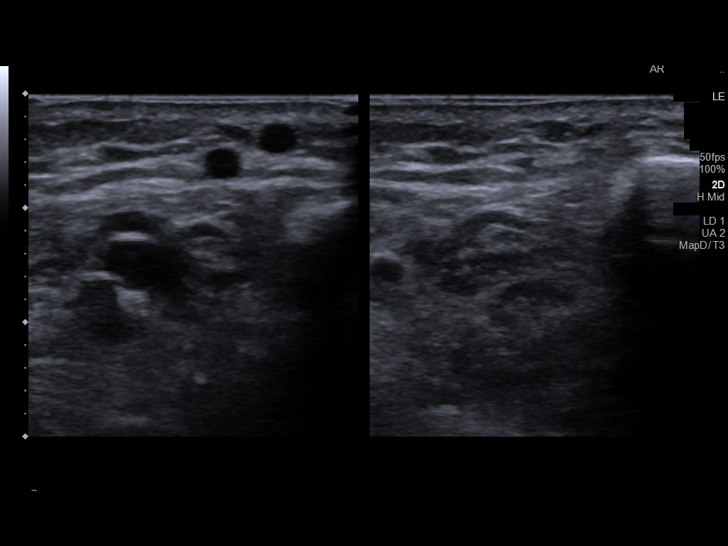
[im 34/42]
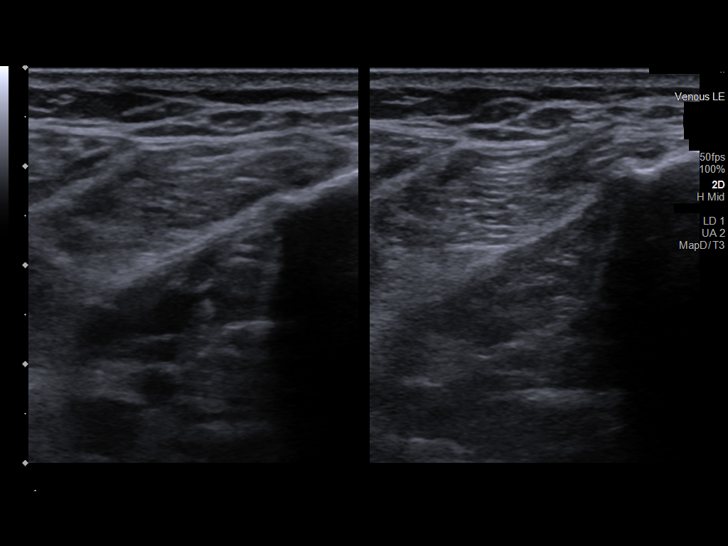
[im 38/42]
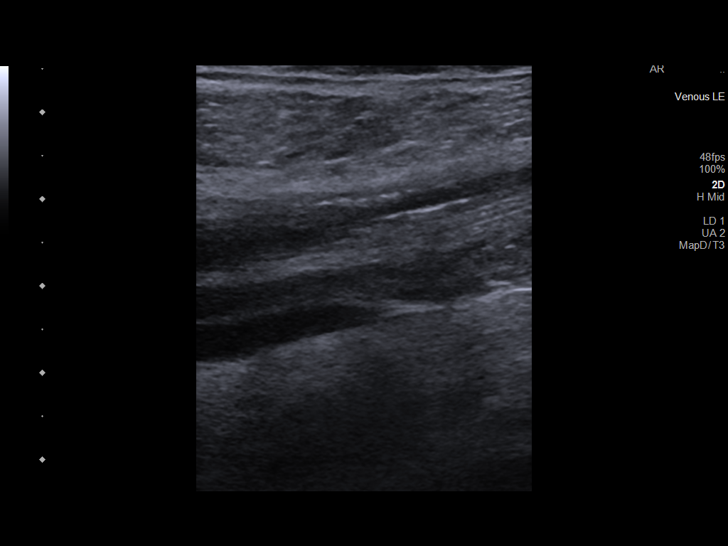
[im 42/42]
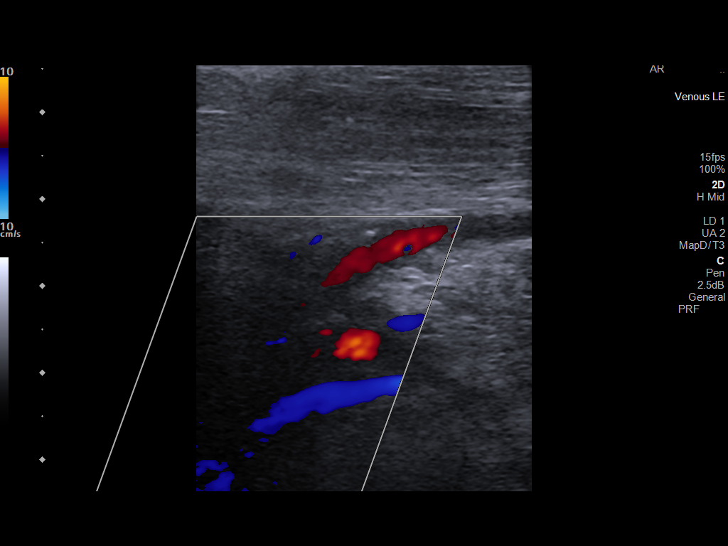

[13 of 24 positions shown; findings below may reference images not displayed]

FINDINGS: Contralateral Common Femoral Vein: Respiratory phasicity is normal
and symmetric with the symptomatic side. No evidence of thrombus.
Normal compressibility.

Common Femoral Vein: No evidence of thrombus. Normal
compressibility, respiratory phasicity and response to augmentation.

Saphenofemoral Junction: No evidence of thrombus. Normal
compressibility and flow on color Doppler imaging.

Profunda Femoral Vein: No evidence of thrombus. Normal
compressibility and flow on color Doppler imaging.

Femoral Vein: No evidence of thrombus. Normal compressibility,
respiratory phasicity and response to augmentation.

Popliteal Vein: No evidence of thrombus. Normal compressibility,
respiratory phasicity and response to augmentation.

Calf Veins: No evidence of thrombus. Normal compressibility and flow
on color Doppler imaging.

Superficial Great Saphenous Vein: No evidence of thrombus. Normal
compressibility and flow on color Doppler imaging.

Other Findings:  None.
IMPRESSION: Sonographic survey of the left lower extremity negative for DVT

## 2021-07-27 ENCOUNTER — Encounter: Payer: Self-pay | Admitting: Podiatry

## 2021-07-27 ENCOUNTER — Ambulatory Visit: Payer: Medicare PPO | Admitting: Podiatry

## 2021-07-27 ENCOUNTER — Other Ambulatory Visit: Payer: Self-pay

## 2021-07-27 DIAGNOSIS — M79675 Pain in left toe(s): Secondary | ICD-10-CM | POA: Diagnosis not present

## 2021-07-27 DIAGNOSIS — M722 Plantar fascial fibromatosis: Secondary | ICD-10-CM

## 2021-07-27 DIAGNOSIS — B351 Tinea unguium: Secondary | ICD-10-CM

## 2021-07-27 DIAGNOSIS — L309 Dermatitis, unspecified: Secondary | ICD-10-CM | POA: Diagnosis not present

## 2021-07-27 DIAGNOSIS — M79674 Pain in right toe(s): Secondary | ICD-10-CM

## 2021-07-27 MED ORDER — CLOBETASOL PROPIONATE 0.05 % EX OINT: 1.0000 "application " | TOPICAL_OINTMENT | Freq: Two times a day (BID) | CUTANEOUS | 0 refills | Status: DC

## 2021-07-27 NOTE — Progress Notes (Signed)
°  Subjective:  Patient ID: Samuel Pope, male    DOB: [birth year],  MRN: [id number]  Chief Complaint  Patient presents with   Foot Pain    NP - Right foot pain    81 y.o. male presents with the above complaint. History confirmed with patient.  He has pain in the arch of the right foot.  He does have a history of gout and had a flare a few weeks ago.  Also has thickened elongated nails he cannot cut.  Has developed a large patch of dry skin that is painful and itchy on the medial heel bilateral.  Objective:  Physical Exam: warm, good capillary refill, no trophic changes or ulcerative lesions, normal DP and PT pulses, normal sensory exam, and mild arch pain on the right foot to palpation of the mid plantar fascia, no pain in the heel, elongated thickened nails x10 with yellow-brown discoloration at subungual debris, there is a large cracking hyperkeratotic lesion on the medial heel bilateral. Assessment:   1. Plantar fasciitis, right   2. Dermatitis of foot   3. Pain due to onychomycosis of toenails of both feet      Plan:  Patient was evaluated and treated and all questions answered.  Discussed the etiology and treatment options for the condition in detail with the patient. Educated patient on the topical and oral treatment options for mycotic nails. Recommended debridement of the nails today. Sharp and mechanical debridement performed of all painful and mycotic nails today. Nails debrided in length and thickness using a nail nipper to level of comfort. Discussed treatment options including appropriate shoe gear. Follow up as needed for painful nails.  The large areas on the medial heel bilaterally are consistent with a large form of dermatitis.  I recommended treatment of this with a topical steroid so I sent a Rx for Temovate ointment for him to the pharmacy.  Use twice daily.  May add Protopic if needed.  If not improving in about 1 month then I would recommend planning for a punch biopsy  of the area as well.  He has mild arch pain and I suspect this is likely mild Planter fasciitis.  We discussed supporting this with a over-the-counter arch support.  Would like to wait until his heels have improved as I think they may cause pressure and irritate them in their current state.  We will revisit this after his heels improved.  No follow-ups on file.

## 2021-08-04 ENCOUNTER — Telehealth: Payer: Self-pay

## 2021-08-04 ENCOUNTER — Other Ambulatory Visit: Payer: Self-pay

## 2021-08-04 MED ORDER — LISINOPRIL-HYDROCHLOROTHIAZIDE 20-12.5 MG PO TABS: 1.0000 | ORAL_TABLET | Freq: Every day | ORAL | 2 refills | Status: DC

## 2021-08-04 NOTE — Telephone Encounter (Signed)
Patient called need lisinopril-hydrochlorothiazide (ZESTORETIC) 20-12.5 MG tablet refill.  Only has 1 pill left  [facility] [address] Linna Hoff

## 2021-08-25 ENCOUNTER — Other Ambulatory Visit: Payer: Self-pay

## 2021-08-25 ENCOUNTER — Ambulatory Visit: Payer: Medicare PPO | Admitting: Podiatry

## 2021-08-25 DIAGNOSIS — L309 Dermatitis, unspecified: Secondary | ICD-10-CM | POA: Diagnosis not present

## 2021-08-25 MED ORDER — CLOBETASOL PROPIONATE 0.05 % EX OINT: 1.0000 "application " | TOPICAL_OINTMENT | Freq: Two times a day (BID) | CUTANEOUS | 2 refills | Status: DC

## 2021-08-30 NOTE — Progress Notes (Signed)
°  Subjective:  Patient ID: Samuel Pope, male    DOB: 04-14-1941,  MRN: [id number]  Chief Complaint  Patient presents with   Plantar Fasciitis    Follow up right foot    81 y.o. male presents with the above complaint. History confirmed with patient.  Skin seems to be improving and his pain is improved as well.  Objective:  Physical Exam: warm, good capillary refill, no trophic changes or ulcerative lesions, normal DP and PT pulses, normal sensory exam, and mild arch pain on the right foot to palpation of the mid plantar fascia, no pain in the heel, elongated thickened nails x10 with yellow-brown discoloration at  Subungual debris, there is a large cracking hyperkeratotic lesion on the medial heel bilateral. Assessment:   1. Dermatitis of foot       Plan:  Patient was evaluated and treated and all questions answered.   He has had a quite a bit of improvement with Temovate ointment.  I recommend he continue this twice daily and I sent a refill of this.  Advised if its not improving by April then he will let me know how he is doing we may add tacrolimus ointment as well.     Return if symptoms worsen or fail to improve.

## 2021-09-22 ENCOUNTER — Other Ambulatory Visit (INDEPENDENT_AMBULATORY_CARE_PROVIDER_SITE_OTHER): Payer: Self-pay | Admitting: Nurse Practitioner

## 2021-09-22 DIAGNOSIS — J0110 Acute frontal sinusitis, unspecified: Secondary | ICD-10-CM

## 2021-10-06 ENCOUNTER — Telehealth: Payer: Self-pay | Admitting: Nurse Practitioner

## 2021-10-06 NOTE — Telephone Encounter (Signed)
Patient needs refill on  ? ?lisinopril-hydrochlorothiazide (ZESTORETIC) 20-12.5 MG tablet  ? ? ?

## 2021-10-07 ENCOUNTER — Other Ambulatory Visit: Payer: Self-pay

## 2021-10-07 MED ORDER — LISINOPRIL-HYDROCHLOROTHIAZIDE 20-12.5 MG PO TABS: 1.0000 | ORAL_TABLET | Freq: Every day | ORAL | 2 refills | Status: DC

## 2021-10-07 NOTE — Telephone Encounter (Signed)
Spoke to pt to confirm where he wanted refill to be sent, pt would like mail order pharmacy. Rx sent.  ?

## 2021-10-27 DIAGNOSIS — H353211 Exudative age-related macular degeneration, right eye, with active choroidal neovascularization: Secondary | ICD-10-CM | POA: Diagnosis not present

## 2021-10-31 ENCOUNTER — Other Ambulatory Visit: Payer: Self-pay | Admitting: Nurse Practitioner

## 2021-11-10 DIAGNOSIS — H353221 Exudative age-related macular degeneration, left eye, with active choroidal neovascularization: Secondary | ICD-10-CM | POA: Diagnosis not present

## 2021-11-12 DIAGNOSIS — E785 Hyperlipidemia, unspecified: Secondary | ICD-10-CM | POA: Diagnosis not present

## 2021-11-12 DIAGNOSIS — I10 Essential (primary) hypertension: Secondary | ICD-10-CM | POA: Diagnosis not present

## 2021-11-13 LAB — CMP14+EGFR
ALT: 22 IU/L (ref 0–44)
AST: 17 IU/L (ref 0–40)
Albumin/Globulin Ratio: 1.9 (ref 1.2–2.2)
Albumin: 4.5 g/dL (ref 3.6–4.6)
Alkaline Phosphatase: 72 IU/L (ref 44–121)
BUN/Creatinine Ratio: 14 (ref 10–24)
BUN: 17 mg/dL (ref 8–27)
Bilirubin Total: 0.4 mg/dL (ref 0.0–1.2)
CO2: 26 mmol/L (ref 20–29)
Calcium: 9.7 mg/dL (ref 8.6–10.2)
Chloride: 103 mmol/L (ref 96–106)
Creatinine, Ser: 1.19 mg/dL (ref 0.76–1.27)
Globulin, Total: 2.4 g/dL (ref 1.5–4.5)
Glucose: 105 mg/dL — ABNORMAL HIGH (ref 70–99)
Potassium: 4.6 mmol/L (ref 3.5–5.2)
Sodium: 142 mmol/L (ref 134–144)
Total Protein: 6.9 g/dL (ref 6.0–8.5)
eGFR: 61 mL/min/1.73

## 2021-11-13 LAB — LIPID PANEL
Chol/HDL Ratio: 5.3 ratio — ABNORMAL HIGH (ref 0.0–5.0)
Cholesterol, Total: 203 mg/dL — ABNORMAL HIGH (ref 100–199)
HDL: 38 mg/dL — ABNORMAL LOW (ref >39)
LDL Chol Calc (NIH): 136 mg/dL — ABNORMAL HIGH (ref 0–99)
Triglycerides: 162 mg/dL — ABNORMAL HIGH (ref 0–149)
VLDL Cholesterol Cal: 29 mg/dL (ref 5–40)

## 2021-11-16 ENCOUNTER — Ambulatory Visit: Payer: Medicare PPO | Admitting: Nurse Practitioner

## 2021-11-17 ENCOUNTER — Ambulatory Visit (INDEPENDENT_AMBULATORY_CARE_PROVIDER_SITE_OTHER): Payer: Medicare PPO | Admitting: Nurse Practitioner

## 2021-11-17 ENCOUNTER — Encounter: Payer: Self-pay | Admitting: Nurse Practitioner

## 2021-11-17 VITALS — BP 130/78 | HR 53 | Ht 74.0 in | Wt 182.0 lb

## 2021-11-17 DIAGNOSIS — F17200 Nicotine dependence, unspecified, uncomplicated: Secondary | ICD-10-CM | POA: Diagnosis not present

## 2021-11-17 DIAGNOSIS — I10 Essential (primary) hypertension: Secondary | ICD-10-CM

## 2021-11-17 DIAGNOSIS — J449 Chronic obstructive pulmonary disease, unspecified: Secondary | ICD-10-CM | POA: Diagnosis not present

## 2021-11-17 DIAGNOSIS — E785 Hyperlipidemia, unspecified: Secondary | ICD-10-CM | POA: Diagnosis not present

## 2021-11-17 MED ORDER — NICOTINE 7 MG/24HR TD PT24: 7.0000 mg | MEDICATED_PATCH | Freq: Every day | TRANSDERMAL | 0 refills | Status: DC

## 2021-11-17 MED ORDER — SIMVASTATIN 10 MG PO TABS: 10.0000 mg | ORAL_TABLET | Freq: Every day | ORAL | 3 refills | Status: DC

## 2021-11-17 MED ORDER — NICOTINE 14 MG/24HR TD PT24: 14.0000 mg | MEDICATED_PATCH | Freq: Every day | TRANSDERMAL | 0 refills | Status: AC

## 2021-11-17 NOTE — Assessment & Plan Note (Signed)
Has been smoking for over 60 years states that he smokes half pack of cigarettes daily, need to quit smoking including worsening COPD, risk of lung cancer, MI discussed with patient ?Patient willing to try nicotine patch for smoking cessation ?Rx nicotine patch 14 mg daily for 6 weeks afterwards aply nicotine patch 7 mg daily for 2 weeks ?Follow-up in 2 months ?

## 2021-11-17 NOTE — Assessment & Plan Note (Signed)
BP Readings from Last 3 Encounters:  ?11/17/21 130/78  ?07/17/21 101/70  ?06/03/21 128/83  ?Chronic condition well-controlled lisinopril-hydrochlorothiazide 20-12.5 mg tablet daily ?Continue current medication ?DASH diet advised engage in regular exercises at least 150 minutes weekly ?

## 2021-11-17 NOTE — Assessment & Plan Note (Signed)
Chronic condition well-controlled ?Has not needed albuterol inhaler no recently ?Continues to smoke daily need to quit smoking discussed with patient, Rx nicotine patch  for smoking cessation. ? ?

## 2021-11-17 NOTE — Progress Notes (Signed)
? ?  Samuel Pope     MRN: [id number]      DOB: 13-Jan-1941 ? ? ?HPI ?Samuel Pope with past medical history of essential hypertension, COPD, hyperlipidemia, prediabetes, current smoker is here for follow up and re-evaluation of chronic medical conditions, medication management and review of any available recent lab and radiology data.  ? ?Has well-controlled COPD ,needs handicap packing sticker, tired after walking about 200 feet. He has been using his wife's  handicap sticker but would like to have his own,   ? ?Currently smokes half a pack of cigarettes daily, has been smoking about 60 years, states that he insurance denied CT chest scan for lung cancer due to his age. Would like to try nicotine patch for smoking ceastion.  ? ? ? ? ?ROS ?Denies recent fever or chills. ?Denies sinus pressure, nasal congestion, ear pain or sore throat. ?Denies chest congestion, productive cough or wheezing. ?Denies chest pains, palpitations and leg swelling ?Denies abdominal pain, nausea, vomiting,diarrhea or constipation.   ?Denies dysuria, frequency, hesitancy or incontinence. ?Denies joint pain, swelling and limitation in mobility. ?Denies depression, anxiety or insomnia. ? ? ? ?PE ? ?BP 130/78 (BP Location: Right Arm, Cuff Size: Large)   Pulse (!) 53   Ht '6\' 2"'$  (1.88 m)   Wt 182 lb (82.6 kg)   SpO2 97%   BMI 23.37 kg/m?  ? ?Patient alert and oriented and in no cardiopulmonary distress. ? ?Chest: Clear to auscultation bilaterally. ? ?CVS: S1, S2 no murmurs, no S3.Regular rate. ? ?ABD: Soft non tender.  ? ?Ext: No edema ? ?MS: Adequate ROM spine, shoulders, hips and knees. ? ?Skin: Intact, no ulcerations or rash noted. ? ?Psych: Good eye contact, normal affect. Memory intact not anxious or depressed appearing. ? ? ?Assessment & Plan ? ?Hyperlipidemia, unspecified ?Lab Results  ?Component Value Date  ? CHOL 203 (H) 11/12/2021  ? HDL 38 (L) 11/12/2021  ? LDLCALC 136 (H) 11/12/2021  ? TRIG 162 (H) 11/12/2021  ? CHOLHDL 5.3 (H)  11/12/2021  ? ?Was previously on simvastatin 20 mg daily.  Stated that he stopped taking medication because he is cholesterol got better ?Need to restart simvastatin discussed with patient due to his LDL and current smoking status ?Rx simvastatin 10 mg daily avoid fatty fried foods follow-up in 8 weeks. ? ?Essential (primary) hypertension ?BP Readings from Last 3 Encounters:  ?11/17/21 130/78  ?07/17/21 101/70  ?06/03/21 128/83  ?Chronic condition well-controlled lisinopril-hydrochlorothiazide 20-12.5 mg tablet daily ?Continue current medication ?DASH diet advised engage in regular exercises at least 150 minutes weekly ? ?Chronic obstructive pulmonary disease, unspecified ([place]) ?Chronic condition well-controlled ?Has not needed albuterol inhaler no recently ?Continues to smoke daily need to quit smoking discussed with patient, Rx nicotine patch  for smoking cessation. ? ? ?Current smoker ?Has been smoking for over 60 years states that he smokes half pack of cigarettes daily, need to quit smoking including worsening COPD, risk of lung cancer, MI discussed with patient ?Patient willing to try nicotine patch for smoking cessation ?Rx nicotine patch 14 mg daily for 6 weeks afterwards aply nicotine patch 7 mg daily for 2 weeks ?Follow-up in 2 months  ?

## 2021-11-17 NOTE — Assessment & Plan Note (Addendum)
Lab Results  ?Component Value Date  ? CHOL 203 (H) 11/12/2021  ? HDL 38 (L) 11/12/2021  ? LDLCALC 136 (H) 11/12/2021  ? TRIG 162 (H) 11/12/2021  ? CHOLHDL 5.3 (H) 11/12/2021  ? ?Was previously on simvastatin 20 mg daily.  Stated that he stopped taking medication because he is cholesterol got better ?Need to restart simvastatin discussed with patient due to his LDL and current smoking status ?Rx simvastatin 10 mg daily avoid fatty fried foods follow-up in 8 weeks. ?

## 2021-11-17 NOTE — Patient Instructions (Addendum)
Pleas get your shingles and pneumonia vaccine at your pharmacy.  ? ?Pleas start taking simvastatin '10mg'$  daily for your high cholesterol.  ? ? ?nicotine patch '14mg'$  , Place 1 patch (14 mg total) onto the skin daily., Starting Tue 11/17/2021, Until Tue 12/29/2021.  ? ?Afterwards use nicotine patch '7mg'$  daily onto the skin from 12/30/2021 for 2 weeks  ? ? ? ? ?It is important that you exercise regularly at least 30 minutes 5 times a week.  ?Think about what you will eat, plan ahead. ?Choose " clean, green, fresh or frozen" over canned, processed or packaged foods which are more sugary, salty and fatty. ?70 to 75% of food eaten should be vegetables and fruit. ?Three meals at set times with snacks allowed between meals, but they must be fruit or vegetables. ?Aim to eat over a 12 hour period , example 7 am to 7 pm, and STOP after  your last meal of the day. ?Drink water,generally about 64 ounces per day, no other drink is as healthy. Fruit juice is best enjoyed in a healthy way, by EATING the fruit. ? ?Thanks for choosing  [facility], we consider it a privelige to serve you.  ? ?

## 2021-11-23 DIAGNOSIS — H35363 Drusen (degenerative) of macula, bilateral: Secondary | ICD-10-CM | POA: Diagnosis not present

## 2021-11-23 DIAGNOSIS — H353231 Exudative age-related macular degeneration, bilateral, with active choroidal neovascularization: Secondary | ICD-10-CM | POA: Diagnosis not present

## 2021-11-23 DIAGNOSIS — Z961 Presence of intraocular lens: Secondary | ICD-10-CM | POA: Diagnosis not present

## 2021-11-23 DIAGNOSIS — H35453 Secondary pigmentary degeneration, bilateral: Secondary | ICD-10-CM | POA: Diagnosis not present

## 2021-12-03 DIAGNOSIS — H401132 Primary open-angle glaucoma, bilateral, moderate stage: Secondary | ICD-10-CM | POA: Diagnosis not present

## 2021-12-08 DIAGNOSIS — H353211 Exudative age-related macular degeneration, right eye, with active choroidal neovascularization: Secondary | ICD-10-CM | POA: Diagnosis not present

## 2021-12-10 DIAGNOSIS — H353221 Exudative age-related macular degeneration, left eye, with active choroidal neovascularization: Secondary | ICD-10-CM | POA: Diagnosis not present

## 2022-01-08 DIAGNOSIS — E785 Hyperlipidemia, unspecified: Secondary | ICD-10-CM | POA: Diagnosis not present

## 2022-01-09 LAB — LIPID PANEL
Chol/HDL Ratio: 3.8 ratio (ref 0.0–5.0)
Cholesterol, Total: 138 mg/dL (ref 100–199)
HDL: 36 mg/dL — ABNORMAL LOW (ref >39)
LDL Chol Calc (NIH): 77 mg/dL (ref 0–99)
Triglycerides: 142 mg/dL (ref 0–149)
VLDL Cholesterol Cal: 25 mg/dL (ref 5–40)

## 2022-01-11 NOTE — Progress Notes (Signed)
Will discuss results wit pt at his upcoming appointment

## 2022-01-13 DIAGNOSIS — H353211 Exudative age-related macular degeneration, right eye, with active choroidal neovascularization: Secondary | ICD-10-CM | POA: Diagnosis not present

## 2022-01-15 ENCOUNTER — Encounter: Payer: Self-pay | Admitting: Nurse Practitioner

## 2022-01-15 ENCOUNTER — Other Ambulatory Visit: Payer: Self-pay | Admitting: Nurse Practitioner

## 2022-01-15 ENCOUNTER — Ambulatory Visit (INDEPENDENT_AMBULATORY_CARE_PROVIDER_SITE_OTHER): Payer: Medicare PPO | Admitting: Nurse Practitioner

## 2022-01-15 VITALS — BP 135/68 | HR 57 | Ht 72.0 in | Wt 183.0 lb

## 2022-01-15 DIAGNOSIS — I10 Essential (primary) hypertension: Secondary | ICD-10-CM | POA: Diagnosis not present

## 2022-01-15 DIAGNOSIS — F17200 Nicotine dependence, unspecified, uncomplicated: Secondary | ICD-10-CM

## 2022-01-15 DIAGNOSIS — E782 Mixed hyperlipidemia: Secondary | ICD-10-CM

## 2022-01-15 NOTE — Assessment & Plan Note (Signed)
Smokes about 0.5 pack/day  Asked about quitting: confirms that he/she currently smokes cigarettes Advise to quit smoking: Educated about QUITTING to reduce the risk of cancer, cardio and cerebrovascular disease. Assess willingness: Unwilling to quit at this time, but is working on cutting back. Assist with counseling and pharmacotherapy: Counseled for 5 minutes and literature provided. Arrange for follow up: follow up in 4 months and continue to offer help.

## 2022-01-15 NOTE — Progress Notes (Signed)
   Samuel Pope     MRN: [id number]      DOB: Mar 16, 1941   HPI [name] with past medical history of essential hypertension, COPD, hyperlipidemia, current smoker is here for follow up for hyperlipidemia  Hyperlipidemia currently taking simvastatin 10 mg daily, denies muscle aches or any adverse reaction to the medication.   Patient continues to smoke half a pack of cigarettes daily, nicotine patch was ordered at last visit but patient did not get the nicotine patches.  Patient encouraged to call 1-800-QUIT-NOW to get free nicotine patches.    Due for pneumonia and shingles vaccines need for both vaccines discussed patient encouraged to get both vaccines at his pharmacy.     ROS Denies recent fever or chills. Denies sinus pressure, nasal congestion, ear pain or sore throat. Denies chest congestion, productive cough or wheezing. Denies chest pains, palpitations and leg swelling Denies abdominal pain, nausea, vomiting,diarrhea or constipation.   Denies dysuria, frequency, hesitancy or incontinence. Denies headaches, seizures, numbness, or tingling. Denies depression, anxiety or insomnia.    PE  BP 135/68 (BP Location: Right Arm, Patient Position: Sitting, Cuff Size: Normal)   Pulse (!) 57   Ht 6' (1.829 m)   Wt 183 lb (83 kg)   SpO2 95%   BMI 24.82 kg/m   Patient alert and oriented and in no cardiopulmonary distress.  Chest: Clear to auscultation bilaterally.  CVS: S1, S2 no murmurs, no S3.Regular rate.  ABD: Soft non tender.   Ext: No edema  MS: Adequate ROM spine, shoulders, hips and knees.  Psych: Good eye contact, normal affect. Memory intact not anxious or depressed appearing.  CNS: CN 2-12 intact, power,  normal throughout.no focal deficits noted.   Assessment & Plan  Current smoker Smokes about 0.5 pack/day  Asked about quitting: confirms that he/she currently smokes cigarettes Advise to quit smoking: Educated about QUITTING to reduce the risk of  cancer, cardio and cerebrovascular disease. Assess willingness: Unwilling to quit at this time, but is working on cutting back. Assist with counseling and pharmacotherapy: Counseled for 5 minutes and literature provided. Arrange for follow up: follow up in 4 months and continue to offer help.  Essential (primary) hypertension BP Readings from Last 3 Encounters:  01/15/22 135/68  11/17/21 130/78  07/17/21 101/70  Chronic condition well-controlled Continue lisinopril-hydrochlorothiazide 20- 12.5 mg daily\ DASH diet advised engage in regular walking exercises daily Follow-up in 4 months  Hyperlipidemia, unspecified Lab Results  Component Value Date   CHOL 138 01/08/2022   HDL 36 (L) 01/08/2022   LDLCALC 77 01/08/2022   TRIG 142 01/08/2022   CHOLHDL 3.8 01/08/2022  Chronic condition well-controlled on simvastatin 10 mg daily Continue current medication

## 2022-01-15 NOTE — Assessment & Plan Note (Signed)
BP Readings from Last 3 Encounters:  01/15/22 135/68  11/17/21 130/78  07/17/21 101/70  Chronic condition well-controlled Continue lisinopril-hydrochlorothiazide 20- 12.5 mg daily\ DASH diet advised engage in regular walking exercises daily Follow-up in 4 months

## 2022-01-15 NOTE — Assessment & Plan Note (Signed)
Lab Results  Component Value Date   CHOL 138 01/08/2022   HDL 36 (L) 01/08/2022   LDLCALC 77 01/08/2022   TRIG 142 01/08/2022   CHOLHDL 3.8 01/08/2022  Chronic condition well-controlled on simvastatin 10 mg daily Continue current medication

## 2022-01-15 NOTE — Patient Instructions (Addendum)
Please cal this number to get free nicotine patch 1-800-QUIT-NOW, please continue to work on smoking cessation.     It is important that you exercise regularly at least 30 minutes 5 times a week.  Think about what you will eat, plan ahead. Choose " clean, green, fresh or frozen" over canned, processed or packaged foods which are more sugary, salty and fatty. 70 to 75% of food eaten should be vegetables and fruit. Three meals at set times with snacks allowed between meals, but they must be fruit or vegetables. Aim to eat over a 12 hour period , example 7 am to 7 pm, and STOP after  your last meal of the day. Drink water,generally about 64 ounces per day, no other drink is as healthy. Fruit juice is best enjoyed in a healthy way, by EATING the fruit.  Thanks for choosing [facility], we consider it a privelige to serve you.

## 2022-01-18 DIAGNOSIS — H353221 Exudative age-related macular degeneration, left eye, with active choroidal neovascularization: Secondary | ICD-10-CM | POA: Diagnosis not present

## 2022-02-17 DIAGNOSIS — H353211 Exudative age-related macular degeneration, right eye, with active choroidal neovascularization: Secondary | ICD-10-CM | POA: Diagnosis not present

## 2022-03-02 DIAGNOSIS — H353221 Exudative age-related macular degeneration, left eye, with active choroidal neovascularization: Secondary | ICD-10-CM | POA: Diagnosis not present

## 2022-03-05 ENCOUNTER — Ambulatory Visit
Admission: EM | Admit: 2022-03-05 | Discharge: 2022-03-05 | Disposition: A | Discharge status: Home / Self Care | Payer: Medicare PPO | Attending: Family Medicine | Admitting: Family Medicine

## 2022-03-05 ENCOUNTER — Encounter: Payer: Self-pay | Admitting: Emergency Medicine

## 2022-03-05 DIAGNOSIS — L989 Disorder of the skin and subcutaneous tissue, unspecified: Secondary | ICD-10-CM | POA: Diagnosis not present

## 2022-03-05 DIAGNOSIS — L03115 Cellulitis of right lower limb: Secondary | ICD-10-CM

## 2022-03-05 DIAGNOSIS — M79671 Pain in right foot: Secondary | ICD-10-CM

## 2022-03-05 DIAGNOSIS — Z8739 Personal history of other diseases of the musculoskeletal system and connective tissue: Secondary | ICD-10-CM | POA: Diagnosis not present

## 2022-03-05 DIAGNOSIS — M109 Gout, unspecified: Secondary | ICD-10-CM

## 2022-03-05 HISTORY — DX: Gout, unspecified: M10.9

## 2022-03-05 MED ORDER — DOXYCYCLINE HYCLATE 100 MG PO CAPS: 100.0000 mg | ORAL_CAPSULE | Freq: Two times a day (BID) | ORAL | 0 refills | Status: DC

## 2022-03-05 MED ORDER — ALLOPURINOL 100 MG PO TABS: 100.0000 mg | ORAL_TABLET | Freq: Every day | ORAL | 2 refills | Status: DC

## 2022-03-05 MED ORDER — PREDNISONE 20 MG PO TABS: 40.0000 mg | ORAL_TABLET | Freq: Every day | ORAL | 0 refills | Status: DC

## 2022-03-05 NOTE — ED Provider Notes (Signed)
[facility]    CSN: [id number] Arrival date & time: 03/05/22  1757      History   Chief Complaint No chief complaint on file.   HPI Samuel Pope is a 81 y.o. male.   Presenting today with right great toe pain, redness for about a week now.  He is now also having diffuse redness across the foot after picking at a large warty area on the medial edge of his foot throughout the week.  He denies any drainage, fevers, body aches, sweats, chills.  Has a history of gout in his toe and states his symptoms feel similar to that.  He stopped taking his allopurinol and just restarted taking it since symptoms started back but states is an old out of day prescription.  Trying Tylenol which helps take the edge off the pain momentarily.   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   Glaucoma 07/18/2021   Right foot pain 07/18/2021   Lesion of skin of foot 07/18/2021   Gout 07/18/2021   Current smoker 07/18/2021   Prediabetes 11/22/2019   Hyperlipidemia, unspecified    Chronic obstructive pulmonary disease, unspecified ([place])    Anesthesia of skin    Disorder of the skin and subcutaneous tissue, unspecified    Essential (primary) hypertension    Dizziness and giddiness    Low back pain    History of colonic polyps    Diverticulosis of colon without hemorrhage    History of adenomatous polyp of colon 09/30/2015    Past Surgical History:  Procedure Laterality Date   COLONOSCOPY N/A 10/23/2015   Procedure: COLONOSCOPY;   Surgeon: Daneil Dolin, MD;  Location: [facility];  Service: Endoscopy;  Laterality: N/A;  0915   COLONOSCOPY N/A 06/13/2019   Procedure: COLONOSCOPY;  Surgeon: Daneil Dolin, MD;  Location: [facility];  Service: Endoscopy;  Laterality: N/A;  7:30am   POLYPECTOMY  06/13/2019   Procedure: POLYPECTOMY;  Surgeon: Daneil Dolin, MD;  Location: [facility];  Service: Endoscopy;;   TONSILECTOMY, ADENOIDECTOMY, BILATERAL MYRINGOTOMY AND TUBES     TRANSURETHRAL RESECTION OF PROSTATE       Home Medications    Prior to Admission medications   Medication Sig Start Date End Date Taking? Authorizing Provider  doxycycline (VIBRAMYCIN) 100 MG capsule Take 1 capsule (100 mg total) by mouth 2 (two) times daily. 03/05/22  Yes Volney American, PA-C  predniSONE (DELTASONE) 20 MG tablet Take 2 tablets (40 mg total) by mouth daily with breakfast. 03/05/22  Yes Volney American, PA-C  acetaminophen (TYLENOL) 500 MG tablet Take 500 mg by mouth every 6 (six) hours as needed.    [provider]  albuterol (VENTOLIN HFA) 108 (90 Base) MCG/ACT inhaler Inhale 2 puffs into the lungs every 6 (six) hours as needed for wheezing or shortness of breath. 07/17/21   Renee Rival, FNP  allopurinol (ZYLOPRIM) 100 MG tablet Take 1  tablet (100 mg total) by mouth daily. 03/05/22   Volney American, PA-C  clobetasol ointment (TEMOVATE) 6.72 % Apply 1 application topically 2 (two) times daily. Patient not taking: Reported on 11/17/2021 08/25/21   Criselda Peaches, DPM  fluticasone [facility]) 50 MCG/ACT nasal spray Place 2 sprays into both nostrils daily. 02/19/21   Ailene Ards, NP  latanoprost (XALATAN) 0.005 % ophthalmic solution Place 1 drop into both eyes at bedtime. 02/01/19   [provider]  lisinopril-hydrochlorothiazide (ZESTORETIC) 20-12.5 MG tablet TAKE 1 TABLET EVERY DAY 01/15/22   [facility], Dewaine Conger, FNP  nicotine (NICODERM CQ - DOSED IN MG/24 HR) 7 mg/24hr patch Place 1 patch (7  mg total) onto the skin daily. Patient not taking: Reported on 01/15/2022 12/30/21   Renee Rival, FNP  ofloxacin (OCUFLOX) 0.3 % ophthalmic solution Place 1 drop into both eyes as directed. Patient not taking: Reported on 01/15/2022 10/04/20   [provider]  simvastatin (ZOCOR) 10 MG tablet Take 1 tablet (10 mg total) by mouth at bedtime. 11/17/21   Renee Rival, FNP   Family History Family History  Problem Relation Age of Onset   Heart disease Father    Colon polyps Sister        "a lot of colon polyps"   Colon cancer Brother    Prostate cancer Brother    Colon cancer Paternal Uncle     Social History Social History   Tobacco Use   Smoking status: Some Days    Packs/day: 1.00    Types: Cigarettes   Smokeless tobacco: Never   Tobacco comments:    Started smoking at age 43, he smokes one pack a day. 07/17/2021. Pt is not ready to quit.   Vaping Use   Vaping Use: Never used  Substance Use Topics   Alcohol use: Not Currently    Comment: 1-2 beers a week   Drug use: No     Allergies   Penicillins   Review of Systems Review of Systems Per HPI  Physical Exam Triage Vital Signs ED Triage Vitals  Enc Vitals Group     BP 03/05/22 1805 (!) 163/79     Pulse Rate 03/05/22 1805 75     Resp 03/05/22 1805 18     Temp 03/05/22 1805 98.6 F (37 C)     Temp Source 03/05/22 1805 Oral     SpO2 03/05/22 1805 94 %     Weight --      Height --      Head Circumference --      Peak Flow --      Pain Score 03/05/22 1806 8     Pain Loc --      Pain Edu? --      Excl. in [place]? --    No data found.  Updated Vital Signs BP (!) 163/79 (BP Location: Right Arm)   Pulse 75   Temp 98.6 F (37 C) (Oral)   Resp 18   SpO2 94%   Visual Acuity Right Eye Distance:   Left Eye Distance:   Bilateral Distance:    Right Eye Near:   Left Eye Near:    Bilateral Near:     Physical Exam Vitals and nursing note reviewed.  Constitutional:      Appearance: Normal  appearance.  HENT:     Head: Atraumatic.  Eyes:     Extraocular Movements: Extraocular movements intact.     Conjunctiva/sclera: Conjunctivae normal.  Cardiovascular:  Rate and Rhythm: Normal rate and regular rhythm.  Pulmonary:     Effort: Pulmonary effort is normal.     Breath sounds: Normal breath sounds.  Musculoskeletal:        General: Swelling and tenderness present. Normal range of motion.     Cervical back: Normal range of motion and neck supple.  Skin:    General: Skin is warm.     Comments: Large 3 cm oval shaped warty lesion with some scabbing from patient picking at it, surrounding erythema extending diffusely across foot.  Localized worsened erythema, edema at base of great toe, tender to palpation  Neurological:     Mental Status: Mental status is at baseline.     Comments: Right foot neurovascularly intact  Psychiatric:        Mood and Affect: Mood normal.        Thought Content: Thought content normal.        Judgment: Judgment normal.      UC Treatments / Results  Labs (all labs ordered are listed, but only abnormal results are displayed) Labs Reviewed - No data to display  EKG   Radiology No results found.  Procedures Procedures (including critical care time)  Medications Ordered in UC Medications - No data to display  Initial Impression / Assessment and Plan / UC Course  I have reviewed the triage vital signs and the nursing notes.  Pertinent labs & imaging results that were available during my care of the patient were reviewed by me and considered in my medical decision making (see chart for details).     Do suspect gout in the great toe, questionable early cellulitis from picking at the warty lesion on his foot.  Will cover for both with prednisone, doxycycline and good wound care to the warty place.  We will also refill his allopurinol that he takes chronically as he states he is looking for a new primary care provider currently.  He plans  to also follow-up with podiatry for recheck.  Return for any worsening symptoms.  Final Clinical Impressions(s) / UC Diagnoses   Final diagnoses:  Right foot pain  History of gout  Foot lesion  Cellulitis of right lower extremity     Discharge Instructions      You may go to the find a provider tab on the [facility] website to see who is taking new patients currently and even schedule your new patient appointment online.    ED Prescriptions     Medication Sig Dispense Auth. Provider   predniSONE (DELTASONE) 20 MG tablet Take 2 tablets (40 mg total) by mouth daily with breakfast. 10 tablet Volney American, PA-C   doxycycline (VIBRAMYCIN) 100 MG capsule Take 1 capsule (100 mg total) by mouth 2 (two) times daily. 14 capsule Volney American, [place]   allopurinol (ZYLOPRIM) 100 MG tablet Take 1 tablet (100 mg total) by mouth daily. 30 tablet Volney American, [place]      PDMP not reviewed this encounter.   Volney American, [place] 03/05/22 1850

## 2022-03-05 NOTE — Discharge Instructions (Addendum)
You may go to the find a provider tab on the [facility] website to see who is taking new patients currently and even schedule your new patient appointment online.

## 2022-03-05 NOTE — ED Triage Notes (Signed)
Right great toe pain x 1 week.  States he has not been taking his gout medication.  Wants a referral to a foot doctor.

## 2022-03-10 ENCOUNTER — Ambulatory Visit (INDEPENDENT_AMBULATORY_CARE_PROVIDER_SITE_OTHER): Payer: Medicare PPO | Admitting: Internal Medicine

## 2022-03-10 ENCOUNTER — Encounter: Payer: Self-pay | Admitting: Internal Medicine

## 2022-03-10 VITALS — BP 134/86 | HR 75 | Resp 18 | Ht 72.0 in | Wt 188.4 lb

## 2022-03-10 DIAGNOSIS — L309 Dermatitis, unspecified: Secondary | ICD-10-CM | POA: Insufficient documentation

## 2022-03-10 DIAGNOSIS — L989 Disorder of the skin and subcutaneous tissue, unspecified: Secondary | ICD-10-CM

## 2022-03-10 DIAGNOSIS — M109 Gout, unspecified: Secondary | ICD-10-CM | POA: Diagnosis not present

## 2022-03-10 MED ORDER — COLCHICINE 0.6 MG PO TABS: ORAL_TABLET | ORAL | 0 refills | Status: DC

## 2022-03-10 MED ORDER — ALLOPURINOL 100 MG PO TABS: 100.0000 mg | ORAL_TABLET | Freq: Every day | ORAL | 2 refills | Status: AC

## 2022-03-10 MED ORDER — CLOBETASOL PROPIONATE 0.05 % EX OINT: 1.0000 | TOPICAL_OINTMENT | Freq: Two times a day (BID) | CUTANEOUS | 2 refills | Status: DC

## 2022-03-10 NOTE — Progress Notes (Signed)
Acute Office Visit  Subjective:     Patient ID: Samuel Pope, male    DOB: [birth year], 81 y.o.   MRN: [id number]  Chief Complaint  Patient presents with   Follow-up    Patient went to urgent care 03-05-22 thinks its gout started in right foot started in toe now on top of foot needs prednisone refilled from urgent care    Samuel Pope is an 81 year old gentleman presenting today for right foot pain.  He presented to the emergency department 5 days ago for similar symptoms and was told he was in a gout flare.  He was prescribed prednisone, which has improved his pain and he is here today for follow-up.  Samuel Pope has a known history of gout.  He is prescribed allopurinol, however he only began to take the medication when his foot started to hurt.  He describes significant difficulty with ambulation prior to presenting to the ED on 8/25, however he was able to ambulate into the office today after taking prednisone for the last several days.  Samuel Pope denies additional symptoms or acute concerns currently.  Review of Systems  Musculoskeletal:        Right foot pain  All other systems reviewed and are negative.     Objective:    BP 134/86 (BP Location: Right Arm, Patient Position: Sitting, Cuff Size: Normal)   Pulse 75   Resp 18   Ht 6' (1.829 m)   Wt 188 lb 6.4 oz (85.5 kg)   SpO2 97%   BMI 25.55 kg/m   Physical Exam Feet:     Comments: Increased warmth over the first MCP of the right foot.  Nontender to palpation.  ROM of the foot generally intact.  There is a chronic, nonpurulent wound along the medial portion of the right foot adjacent to the heel.      Assessment & Plan:   Problem List Items Addressed This Visit     Gout - Primary    Recently presented to the ED for gout flare.  Treated with prednisone x5 days.  He endorses symptomatic improvement today.  There is still some mild erythema and warmth over the first MCP of the right foot.  We discussed gout treatment today, and I  reviewed that allopurinol is a preventive medication that is not intended for as needed use during a gout flare as it can actually worsen symptoms.  He expressed understanding. -We will restart allopurinol today.  I have prescribed colchicine x2 weeks to avoid transient gout flare while initiating allopurinol. -The patient would like to follow-up in 2 weeks to ensure his gout is under control.      Relevant Medications   colchicine 0.6 MG tablet   allopurinol (ZYLOPRIM) 100 MG tablet    Dermatitis of right foot    Previously seen by podiatry in February 2023 for a chronic wound on the medial aspect of the right foot.  This was considered to be dermatitis and he was prescribed Temovate ointment for twice daily use indefinitely.  He states that he used the ointment until he ran out, but notes that it did improve the wound.  He has been out for quite some time.  I have represcribed this medication today and encouraged the patient to apply twice daily as previously instructed.  If his wound is not continue to improve, he will return to podiatry for reevaluation.         Meds ordered this encounter  Medications  colchicine 0.6 MG tablet    Sig: Take 1 tablet (0.6 mg total) by mouth 2 (two) times daily for 7 days, THEN 1 tablet (0.6 mg total) daily for 7 days.    Dispense:  21 tablet    Refill:  0   allopurinol (ZYLOPRIM) 100 MG tablet    Sig: Take 1 tablet (100 mg total) by mouth daily.    Dispense:  30 tablet    Refill:  2   clobetasol ointment (TEMOVATE) 0.05 %    Sig: Apply 1 Application topically 2 (two) times daily.    Dispense:  30 g    Refill:  2    Return in about 2 weeks (around 03/24/2022).  Johnette Abraham, MD

## 2022-03-10 NOTE — Patient Instructions (Signed)
It was a pleasure to see you today.  Thank you for giving [place] the opportunity to be involved in your care.  Below is a brief recap of your visit and next steps.  We will plan to see you again in 2-4 weeks.  Summary Start taking allopurinol once daily Take colchicine 2 tablets daily x 7 days and then once daily for 7 days  Next steps We will see you for follow up in 2-4 weeks Call

## 2022-03-10 NOTE — Assessment & Plan Note (Signed)
Recently presented to the ED for gout flare.  Treated with prednisone x5 days.  He endorses symptomatic improvement today.  There is still some mild erythema and warmth over the first MCP of the right foot.  We discussed gout treatment today, and I reviewed that allopurinol is a preventive medication that is not intended for as needed use during a gout flare as it can actually worsen symptoms.  He expressed understanding. -We will restart allopurinol today.  I have prescribed colchicine x2 weeks to avoid transient gout flare while initiating allopurinol. -The patient would like to follow-up in 2 weeks to ensure his gout is under control.

## 2022-03-10 NOTE — Assessment & Plan Note (Signed)
Previously seen by podiatry in February 2023 for a chronic wound on the medial aspect of the right foot.  This was considered to be dermatitis and he was prescribed Temovate ointment for twice daily use indefinitely.  He states that he used the ointment until he ran out, but notes that it did improve the wound.  He has been out for quite some time.  I have represcribed this medication today and encouraged the patient to apply twice daily as previously instructed.  If his wound is not continue to improve, he will return to podiatry for reevaluation.

## 2022-03-24 DIAGNOSIS — H353211 Exudative age-related macular degeneration, right eye, with active choroidal neovascularization: Secondary | ICD-10-CM | POA: Diagnosis not present

## 2022-04-01 ENCOUNTER — Encounter: Payer: Self-pay | Admitting: Internal Medicine

## 2022-04-01 ENCOUNTER — Ambulatory Visit (INDEPENDENT_AMBULATORY_CARE_PROVIDER_SITE_OTHER): Payer: Medicare PPO | Admitting: Internal Medicine

## 2022-04-01 VITALS — BP 138/80 | HR 75 | Ht 69.0 in | Wt 186.8 lb

## 2022-04-01 DIAGNOSIS — Z0000 Encounter for general adult medical examination without abnormal findings: Secondary | ICD-10-CM

## 2022-04-01 DIAGNOSIS — Z0001 Encounter for general adult medical examination with abnormal findings: Secondary | ICD-10-CM | POA: Diagnosis not present

## 2022-04-01 DIAGNOSIS — L309 Dermatitis, unspecified: Secondary | ICD-10-CM | POA: Diagnosis not present

## 2022-04-01 DIAGNOSIS — M109 Gout, unspecified: Secondary | ICD-10-CM | POA: Diagnosis not present

## 2022-04-01 DIAGNOSIS — I10 Essential (primary) hypertension: Secondary | ICD-10-CM

## 2022-04-01 DIAGNOSIS — H402230 Chronic angle-closure glaucoma, bilateral, stage unspecified: Secondary | ICD-10-CM

## 2022-04-01 DIAGNOSIS — Z2821 Immunization not carried out because of patient refusal: Secondary | ICD-10-CM

## 2022-04-01 MED ORDER — LATANOPROST 0.005 % OP SOLN: 1.0000 [drp] | Freq: Every day | OPHTHALMIC | 2 refills | Status: DC

## 2022-04-01 MED ORDER — LISINOPRIL-HYDROCHLOROTHIAZIDE 20-12.5 MG PO TABS: 1.0000 | ORAL_TABLET | Freq: Every day | ORAL | 2 refills | Status: DC

## 2022-04-01 MED ORDER — COLCHICINE 0.6 MG PO TABS: 0.6000 mg | ORAL_TABLET | Freq: Every day | ORAL | 1 refills | Status: DC

## 2022-04-01 NOTE — Progress Notes (Signed)
Established Patient Office Visit  Subjective   Patient ID: Samuel Pope, male    DOB: [birth year]  Age: 81 y.o. MRN: [id number]  Chief Complaint  Patient presents with   Follow-up    3 week   Samuel Pope returns to care today.  He is an 81 year old gentleman with a past medical history significant for HTN, COPD, gout, glaucoma, prediabetes, and current tobacco use.  Last seen by me on 8/30 for right foot pain.  He had previously been evaluated at the emergency department for a gout flare.  Treated with prednisone.  Allopurinol and colchicine were started at follow-up.  He returns today for reassessment.  Of note, I also refilled Temovate ointment for treatment of dermatitis on his right foot.  Today Samuel Pope states he feels well.  The pain in his right foot has resolved.  He is taking allopurinol daily and has completed colchicine.  He has been applying the Temovate ointment to the wound on his right foot daily and states that it is healing nicely.  He is otherwise asymptomatic and denies additional concerns.  Past Medical History:  Diagnosis Date   Anesthesia of skin    Chronic obstructive pulmonary disease, unspecified (HCC)    Disorder of the skin and subcutaneous tissue, unspecified    Dizziness and giddiness    Essential (primary) hypertension    Gout    Hypercholesterolemia    Hyperlipidemia, unspecified    Hypertension    Kidney stones    Low back pain    Other chest pain    Prostate enlargement    Past Surgical History:  Procedure Laterality Date   COLONOSCOPY N/A 10/23/2015   Procedure: COLONOSCOPY;  Surgeon: Daneil Dolin, MD;  Location: [facility];  Service: Endoscopy;  Laterality: N/A;  0915   COLONOSCOPY N/A 06/13/2019   Procedure: COLONOSCOPY;  Surgeon: Daneil Dolin, MD;  Location: [facility];  Service: Endoscopy;  Laterality: N/A;  7:30am   POLYPECTOMY  06/13/2019   Procedure: POLYPECTOMY;  Surgeon: Daneil Dolin, MD;  Location: [facility];  Service:  Endoscopy;;   TONSILECTOMY, ADENOIDECTOMY, BILATERAL MYRINGOTOMY AND TUBES     TRANSURETHRAL RESECTION OF PROSTATE     Social History   Tobacco Use   Smoking status: Some Days    Packs/day: 1.00    Types: Cigarettes   Smokeless tobacco: Never   Tobacco comments:    Started smoking at age 50, he smokes one pack a day. 07/17/2021. Pt is not ready to quit.   Vaping Use   Vaping Use: Never used  Substance Use Topics   Alcohol use: Not Currently    Comment: 1-2 beers a week   Drug use: No   Family History  Problem Relation Age of Onset   Heart disease Father    Colon polyps Sister        "a lot of colon polyps"   Colon cancer Brother    Prostate cancer Brother    Colon cancer Paternal Uncle    Allergies  Allergen Reactions   Penicillins Swelling    Did it involve swelling of the face/tongue/throat, SOB, or low BP? Yes Did it involve sudden or severe rash/hives, skin peeling, or any reaction on the inside of your mouth or nose? No Did you need to seek medical attention at a hospital or doctor's office? Yes When did it last happen? 15-20 years ago If all above answers are "NO", may proceed with cephalosporin use.  Review of Systems  Musculoskeletal:        Improving pain in right first MTP and healing wound on medial portion of right foot  All other systems reviewed and are negative.    Objective:     BP 138/80   Pulse 75   Ht $R'5\' 9"'nn$  (1.753 m)   Wt 186 lb 12.8 oz (84.7 kg)   SpO2 98%   BMI 27.59 kg/m   Physical Exam Constitutional:      Appearance: Normal appearance.  Musculoskeletal:     Comments: Slight tenderness palpation over the first MTP of the right foot.  There is no erythema appreciated.  Skin:    General: Skin is warm and dry.     Comments: Healing, chronic wound medial portion of right foot that remains nonpurulent or erythematous.  Neurological:     Mental Status: He is alert.    Last CBC Lab Results  Component Value Date   WBC 5.1  07/20/2021   HGB 14.9 07/20/2021   HCT 44.3 07/20/2021   MCV 94 07/20/2021   MCH 31.6 07/20/2021   RDW 11.8 07/20/2021   PLT 242 03/70/4888   Last metabolic panel Lab Results  Component Value Date   GLUCOSE 105 (H) 11/12/2021   NA 142 11/12/2021   K 4.6 11/12/2021   CL 103 11/12/2021   CO2 26 11/12/2021   BUN 17 11/12/2021   CREATININE 1.19 11/12/2021   EGFR 61 11/12/2021   CALCIUM 9.7 11/12/2021   PROT 6.9 11/12/2021   ALBUMIN 4.5 11/12/2021   LABGLOB 2.4 11/12/2021   AGRATIO 1.9 11/12/2021   BILITOT 0.4 11/12/2021   ALKPHOS 72 11/12/2021   AST 17 11/12/2021   ALT 22 11/12/2021   Last lipids Lab Results  Component Value Date   CHOL 138 01/08/2022   HDL 36 (L) 01/08/2022   LDLCALC 77 01/08/2022   TRIG 142 01/08/2022   CHOLHDL 3.8 01/08/2022   Last hemoglobin A1c Lab Results  Component Value Date   HGBA1C 6.1 (H) 07/20/2021   Last thyroid functions Lab Results  Component Value Date   TSH 1.840 07/20/2021     Assessment & Plan:   Problem List Items Addressed This Visit       Dermatitis of right foot    Significantly improved since resuming Temovate ointment twice daily indefinitely.        Gout    Presenting today for follow-up in the setting of recent gout flare.  He has restarted taking allopurinol daily.  He states the pain in his right foot has significantly improved. -Continue allopurinol -Colchicine refilled today.  He should continue taking colchicine x3 months while initiating urate lowering therapy.      Relevant Medications   colchicine 0.6 MG tablet   Preventative health care    Declined outstanding vaccines today      Return in about 2 months (around 06/01/2022).    Johnette Abraham, MD

## 2022-04-01 NOTE — Assessment & Plan Note (Signed)
Declined outstanding vaccines today

## 2022-04-01 NOTE — Assessment & Plan Note (Signed)
Presenting today for follow-up in the setting of recent gout flare.  He has restarted taking allopurinol daily.  He states the pain in his right foot has significantly improved. -Continue allopurinol -Colchicine refilled today.  He should continue taking colchicine x3 months while initiating urate lowering therapy.

## 2022-04-01 NOTE — Patient Instructions (Signed)
It was a pleasure to see you today.  Thank you for giving [place] the opportunity to be involved in your care.  Below is a brief recap of your visit and next steps.  We will plan to see you again in 2 months  Summary I have refilled your medications today  Next steps Follow up in November

## 2022-04-01 NOTE — Assessment & Plan Note (Signed)
Significantly improved since resuming Temovate ointment twice daily indefinitely.

## 2022-04-05 DIAGNOSIS — H353221 Exudative age-related macular degeneration, left eye, with active choroidal neovascularization: Secondary | ICD-10-CM | POA: Diagnosis not present

## 2022-04-28 DIAGNOSIS — H353211 Exudative age-related macular degeneration, right eye, with active choroidal neovascularization: Secondary | ICD-10-CM | POA: Diagnosis not present

## 2022-05-11 DIAGNOSIS — H353221 Exudative age-related macular degeneration, left eye, with active choroidal neovascularization: Secondary | ICD-10-CM | POA: Diagnosis not present

## 2022-05-20 ENCOUNTER — Encounter: Payer: Medicare PPO | Admitting: Nurse Practitioner

## 2022-05-20 ENCOUNTER — Encounter: Payer: Medicare PPO | Admitting: Internal Medicine

## 2022-06-16 ENCOUNTER — Other Ambulatory Visit: Payer: Self-pay | Admitting: Internal Medicine

## 2022-06-16 DIAGNOSIS — H402230 Chronic angle-closure glaucoma, bilateral, stage unspecified: Secondary | ICD-10-CM

## 2022-08-04 ENCOUNTER — Ambulatory Visit (INDEPENDENT_AMBULATORY_CARE_PROVIDER_SITE_OTHER): Payer: Medicare PPO

## 2022-08-04 VITALS — BP 134/77 | HR 78 | Ht 72.0 in | Wt 189.0 lb

## 2022-08-04 DIAGNOSIS — Z0000 Encounter for general adult medical examination without abnormal findings: Secondary | ICD-10-CM | POA: Diagnosis not present

## 2022-08-04 NOTE — Patient Instructions (Signed)
  Samuel Pope , Thank you for taking time to come for your Medicare Wellness Visit. I appreciate your ongoing commitment to your health goals. Please review the following plan we discussed and let me know if I can assist you in the future.   These are the goals we discussed:  Goals   None     This is a list of the screening recommended for you and due dates:  Health Maintenance  Topic Date Due   Zoster (Shingles) Vaccine (1 of 2) Never done   Flu Shot  10/10/2022*   Pneumonia Vaccine (1 - PCV) 04/02/2023*   Medicare Annual Wellness Visit  08/05/2023   DTaP/Tdap/Td vaccine (2 - Td or Tdap) 11/21/2029   HPV Vaccine  Aged Out   COVID-19 Vaccine  Discontinued  *Topic was postponed. The date shown is not the original due date.

## 2022-08-04 NOTE — Progress Notes (Signed)
Subjective:   Samuel Pope is a 82 y.o. male who presents for Medicare Annual/Subsequent preventive examination.  Review of Systems         Objective:    There were no vitals filed for this visit. There is no height or weight on file to calculate BMI.     07/21/2021    3:00 PM 06/13/2019    6:53 AM 10/23/2015    8:19 AM  Advanced Directives  Does Patient Have a Medical Advance Directive? No No No  Would patient like information on creating a medical advance directive? No - Patient declined No - Patient declined No - patient declined information    Current Medications (verified) Outpatient Encounter Medications as of 08/04/2022  Medication Sig   acetaminophen (TYLENOL) 500 MG tablet Take 500 mg by mouth every 6 (six) hours as needed.   albuterol (VENTOLIN HFA) 108 (90 Base) MCG/ACT inhaler Inhale 2 puffs into the lungs every 6 (six) hours as needed for wheezing or shortness of breath.   allopurinol (ZYLOPRIM) 100 MG tablet Take 1 tablet (100 mg total) by mouth daily.   clobetasol ointment (TEMOVATE) 4.58 % Apply 1 Application topically 2 (two) times daily.   colchicine 0.6 MG tablet Take 1 tablet (0.6 mg total) by mouth daily.   doxycycline (VIBRAMYCIN) 100 MG capsule Take 1 capsule (100 mg total) by mouth 2 (two) times daily.   fluticasone (FLONASE) 50 MCG/ACT nasal spray Place 2 sprays into both nostrils daily.   latanoprost (XALATAN) 0.005 % ophthalmic solution INSTILL 1 DROP INTO BOTH EYES AT BEDTIME   lisinopril-hydrochlorothiazide (ZESTORETIC) 20-12.5 MG tablet Take 1 tablet by mouth daily.   predniSONE (DELTASONE) 20 MG tablet Take 2 tablets (40 mg total) by mouth daily with breakfast.   simvastatin (ZOCOR) 10 MG tablet Take 1 tablet (10 mg total) by mouth at bedtime.   No facility-administered encounter medications on file as of 08/04/2022.    Allergies (verified) Penicillins   History: Past Medical History:  Diagnosis Date   Anesthesia of skin    Chronic  obstructive pulmonary disease, unspecified (HCC)    Disorder of the skin and subcutaneous tissue, unspecified    Dizziness and giddiness    Essential (primary) hypertension    Gout    Hypercholesterolemia    Hyperlipidemia, unspecified    Hypertension    Kidney stones    Low back pain    Other chest pain    Prostate enlargement    Past Surgical History:  Procedure Laterality Date   COLONOSCOPY N/A 10/23/2015   Procedure: COLONOSCOPY;  Surgeon: Daneil Dolin, MD;  [facility];  Service: Endoscopy;  Laterality: N/A;  0915   COLONOSCOPY N/A 06/13/2019   Procedure: COLONOSCOPY;  Surgeon: Daneil Dolin, MD;  [facility];  Service: Endoscopy;  Laterality: N/A;  7:30am   POLYPECTOMY  06/13/2019   Procedure: POLYPECTOMY;  Surgeon: Daneil Dolin, MD;  [facility]: [facility];  Service: Endoscopy;;   TONSILECTOMY, ADENOIDECTOMY, BILATERAL MYRINGOTOMY AND TUBES     TRANSURETHRAL RESECTION OF PROSTATE     Family History  Problem Relation Age of Onset   Heart disease Father    Colon polyps Sister        "a lot of colon polyps"   Colon cancer Brother    Prostate cancer Brother    Colon cancer Paternal Uncle    Social History   Socioeconomic History   Marital status: Widowed    Spouse name: Not  on file   Number of children: Not on file   Years of education: Not on file   Highest education level: Not on file  Occupational History   Occupation: retired  Tobacco Use   Smoking status: Some Days    Packs/day: 1.00    Types: Cigarettes   Smokeless tobacco: Never   Tobacco comments:    Started smoking at age 89, he smokes one pack a day. 07/17/2021. Pt is not ready to quit.   Vaping Use   Vaping Use: Never used  Substance and Sexual Activity   Alcohol use: Not Currently    Comment: 1-2 beers a week   Drug use: No   Sexual activity: Not Currently  Other Topics Concern   Not on file  Social History Narrative   Was married for 56 years.Wife died in 05-28-2020-  Dementia.Retired.   Lives home alone now.    Social Determinants of Health   Financial Resource Strain: Low Risk  (07/21/2021)   Overall Financial Resource Strain (CARDIA)    Difficulty of Paying Living Expenses: Not hard at all  Food Insecurity: No Food Insecurity (07/21/2021)   Hunger Vital Sign    Worried About Running Out of Food in the Last Year: Never true    Ran Out of Food in the Last Year: Never true  Transportation Needs: No Transportation Needs (07/21/2021)   PRAPARE - Hydrologist (Medical): No    Lack of Transportation (Non-Medical): No  Physical Activity: Sufficiently Active (07/21/2021)   Exercise Vital Sign    Days of Exercise per Week: 5 days    Minutes of Exercise per Session: 60 min  Stress: No Stress Concern Present (07/21/2021)   [place]    Feeling of Stress : Only a little  Social Connections: Moderately Integrated (07/21/2021)   Social Connection and Isolation Panel [NHANES]    Frequency of Communication with Friends and Family: More than three times a week    Frequency of Social Gatherings with Friends and Family: Once a week    Attends Religious Services: 1 to 4 times per year    Active Member of [employer] or Organizations: Yes    Attends Archivist Meetings: Never    Marital Status: Widowed    Tobacco Counseling Ready to quit: Not Answered Counseling given: Not Answered Tobacco comments: Started smoking at age 57, he smokes one pack a day. 07/17/2021. Pt is not ready to quit.    Clinical Intake:   Diabetic? No     Activities of Daily Living     No data to display           Patient Care Team: Johnette Abraham, MD as PCP - General (Internal Medicine) Gala Romney, Cristopher Estimable, MD as Consulting Physician (Gastroenterology)  Indicate any recent Medical Services you may have received from other than [facility] providers in the past year (date may be  approximate).     Assessment:   This is a routine wellness examination for Samuel Pope.  Hearing/Vision screen No results found.  Dietary issues and exercise activities discussed:     Goals Addressed   None   Depression Screen    04/01/2022    9:06 AM 03/10/2022    3:58 PM 01/15/2022   10:13 AM 11/17/2021    9:37 AM 07/21/2021    2:55 PM 07/17/2021    3:55 PM 01/21/2021    9:24 AM  PHQ 2/9 Scores  PHQ - 2 Score 0 0 0 0 0 0 0  PHQ- 9 Score       0    Fall Risk    04/01/2022    9:06 AM 03/10/2022    3:57 PM 01/15/2022   10:13 AM 11/17/2021    9:36 AM 07/21/2021    3:00 PM  [place] in the past year? 0 0 0 0 0  Number falls in past yr: 0 0 0 0 0  Injury with Fall? 0 0 0 0 0  Risk for fall due to : No Fall Risks No Fall Risks No Fall Risks No Fall Risks No Fall Risks  Follow up Falls evaluation completed Falls evaluation completed Falls evaluation completed Falls evaluation completed Falls evaluation completed    FALL RISK PREVENTION PERTAINING TO THE HOME:  Any stairs in or around the home? No  If so, are there any without handrails? Yes  Home free of loose throw rugs in walkways, pet beds, electrical cords, etc? No  Adequate lighting in your home to reduce risk of falls? Yes   ASSISTIVE DEVICES UTILIZED TO PREVENT FALLS:  Life alert? No  Use of a cane, walker or w/c? No  Grab bars in the bathroom? No  Shower chair or bench in shower? No  Elevated toilet seat or a handicapped toilet? No   TIMED UP AND GO:  Was the test performed? Yes .  Length of time to ambulate 10 feet: 6 sec.   Gait steady and fast without use of assistive device  Cognitive Function:        07/21/2021    3:03 PM  6CIT Screen  What Year? 0 points  What month? 0 points  What time? 0 points  Count back from 20 0 points  Months in reverse 4 points  Repeat phrase 0 points  Total Score 4 points    Immunizations Immunization History  Administered Date(s) Administered   Moderna  Sars-Covid-2 Vaccination 08/31/2019, 09/28/2019   Tdap 11/22/2019    TDAP status: Up to date  Flu Vaccine status: Declined, Education has been provided regarding the importance of this vaccine but patient still declined. Advised may receive this vaccine at local pharmacy or Health Dept. Aware to provide a copy of the vaccination record if obtained from local pharmacy or Health Dept. Verbalized acceptance and understanding.  Pneumococcal vaccine status: Declined,  Education has been provided regarding the importance of this vaccine but patient still declined. Advised may receive this vaccine at local pharmacy or Health Dept. Aware to provide a copy of the vaccination record if obtained from local pharmacy or Health Dept. Verbalized acceptance and understanding.   Covid-19 vaccine status: Completed vaccines  Qualifies for Shingles Vaccine? Yes   Zostavax completed No   Shingrix Completed?: No.    Education has been provided regarding the importance of this vaccine. Patient has been advised to call insurance company to determine out of pocket expense if they have not yet received this vaccine. Advised may also receive vaccine at local pharmacy or Health Dept. Verbalized acceptance and understanding.  Screening Tests Health Maintenance  Topic Date Due   Zoster Vaccines- Shingrix (1 of 2) Never done   INFLUENZA VACCINE  10/10/2022 (Originally 02/09/2022)   Pneumonia Vaccine 32+ Years old (1 - PCV) 04/02/2023 (Originally 10/19/1946)   Medicare Annual Wellness (AWV)  08/05/2023   DTaP/Tdap/Td (2 - Td or Tdap) 11/21/2029   HPV VACCINES  Aged Out   COVID-19 Vaccine  Discontinued    Health Maintenance  Health Maintenance Due  Topic Date Due   Zoster Vaccines- Shingrix (1 of 2) Never done    Colorectal cancer screening: Type of screening: Colonoscopy. Completed 2020. Repeat every 5 years  Lung Cancer Screening: (Low Dose CT Chest recommended if Age 63-80 years, 30 pack-year currently smoking  OR have quit w/in 15years.) does not qualify.   Lung Cancer Screening Referral: NA  Additional Screening:  Hepatitis C Screening: does not qualify; Completed   Vision Screening: Recommended annual ophthalmology exams for early detection of glaucoma and other disorders of the eye. Is the patient up to date with their annual eye exam?  Yes  Who is the provider or what is the name of the office in which the patient attends annual eye exams? Dr. Posey Pronto, retinologist. If pt is not established with a provider, would they like to be referred to a provider to establish care? No .   Dental Screening: Recommended annual dental exams for proper oral hygiene  Community Resource Referral / Chronic Care Management: CRR required this visit?  No   CCM required this visit?  No      Plan:     I have personally reviewed and noted the following in the patient's chart:   Medical and social history Use of alcohol, tobacco or illicit drugs  Current medications and supplements including opioid prescriptions. Patient is not currently taking opioid prescriptions. Functional ability and status Nutritional status Physical activity Advanced directives List of other physicians Hospitalizations, surgeries, and ER visits in previous 12 months Vitals Screenings to include cognitive, depression, and falls Referrals and appointments  In addition, I have reviewed and discussed with patient certain preventive protocols, quality metrics, and best practice recommendations. A written personalized care plan for preventive services as well as general preventive health recommendations were provided to patient.     Smitty Knudsen, CMA   08/04/2022

## 2022-08-31 ENCOUNTER — Other Ambulatory Visit: Payer: Self-pay | Admitting: Internal Medicine

## 2022-08-31 DIAGNOSIS — M109 Gout, unspecified: Secondary | ICD-10-CM

## 2022-08-31 DIAGNOSIS — H402230 Chronic angle-closure glaucoma, bilateral, stage unspecified: Secondary | ICD-10-CM

## 2022-12-13 ENCOUNTER — Encounter: Payer: Self-pay | Admitting: Internal Medicine

## 2022-12-13 ENCOUNTER — Ambulatory Visit (INDEPENDENT_AMBULATORY_CARE_PROVIDER_SITE_OTHER): Payer: Medicare PPO | Admitting: Internal Medicine

## 2022-12-13 VITALS — BP 138/77 | HR 68 | Ht 72.0 in | Wt 185.0 lb

## 2022-12-13 DIAGNOSIS — J449 Chronic obstructive pulmonary disease, unspecified: Secondary | ICD-10-CM | POA: Diagnosis not present

## 2022-12-13 DIAGNOSIS — M1A9XX0 Chronic gout, unspecified, without tophus (tophi): Secondary | ICD-10-CM

## 2022-12-13 DIAGNOSIS — E782 Mixed hyperlipidemia: Secondary | ICD-10-CM | POA: Diagnosis not present

## 2022-12-13 DIAGNOSIS — R7303 Prediabetes: Secondary | ICD-10-CM | POA: Diagnosis not present

## 2022-12-13 DIAGNOSIS — L309 Dermatitis, unspecified: Secondary | ICD-10-CM

## 2022-12-13 DIAGNOSIS — I10 Essential (primary) hypertension: Secondary | ICD-10-CM

## 2022-12-13 DIAGNOSIS — L989 Disorder of the skin and subcutaneous tissue, unspecified: Secondary | ICD-10-CM

## 2022-12-13 DIAGNOSIS — R5383 Other fatigue: Secondary | ICD-10-CM

## 2022-12-13 DIAGNOSIS — M545 Low back pain, unspecified: Secondary | ICD-10-CM

## 2022-12-13 MED ORDER — CLOBETASOL PROPIONATE 0.05 % EX OINT
1.0000 | TOPICAL_OINTMENT | Freq: Two times a day (BID) | CUTANEOUS | 2 refills | Status: DC
Start: 2022-12-13 — End: 2023-09-19

## 2022-12-13 NOTE — Assessment & Plan Note (Signed)
His acute concern today is recent fatigue.  His daughter notes that he is napping more regularly.  Denies associated symptoms and [place]. -Repeat labs ordered today to assess for underlying metabolic etiology contributing to his symptoms

## 2022-12-13 NOTE — Assessment & Plan Note (Signed)
Asymptomatic.  Pulmonary exam is unremarkable today.  Has an albuterol inhaler but has not needed to use it recently. -No medication changes today

## 2022-12-13 NOTE — Assessment & Plan Note (Signed)
BP remains well-controlled on lisinopril-HCTZ.  No medication changes are indicated today.

## 2022-12-13 NOTE — Assessment & Plan Note (Signed)
Resolved.  Using Temovate ointment twice daily as needed.

## 2022-12-13 NOTE — Assessment & Plan Note (Signed)
Chronic, intermittent issue.  Pain is worse with activity and relieved by rest.  No red flag symptoms identified.  His exam today is unremarkable. -Home PT exercises provided -Recommended as needed use of NSAIDs/Tylenol for pain relief

## 2022-12-13 NOTE — Assessment & Plan Note (Signed)
Currently prescribed simvastatin 10 mg daily. -Repeat lipid panel ordered today 

## 2022-12-13 NOTE — Assessment & Plan Note (Signed)
Denies recent gout flares.  Symptoms are currently well-controlled with allopurinol.

## 2022-12-13 NOTE — Progress Notes (Signed)
Established Patient Office Visit  Subjective   Patient ID: Samuel Pope, male    DOB: [birth year]  Age: 82 y.o. MRN: [id number]  Chief Complaint  Patient presents with   Fatigue   Back Pain    About 12/12/2021 lower back pain.    Samuel Pope returns to care today for routine follow-up.  He was last evaluated by me on 04/01/22.  Treated for gout and dermatitis of the right foot at that time.  There have been no acute interval events.  Today Samuel Pope endorses recent fatigue and chronic low back pain.  He is accompanied by his daughter today, he reports that he has been napping more frequently.  Largely denies associated symptoms.  He reports chronic lumbar back pain that is exacerbated by activity.  Currently works on a farm and states that pain is worse at the end of the day after increased activity.  He denies red flag symptoms such as lower extremity weakness, numbness/tingling, saddle anesthesia, fever/chills, unintentional weight loss.  Past Medical History:  Diagnosis Date   Anesthesia of skin    Chronic obstructive pulmonary disease, unspecified (HCC)    Disorder of the skin and subcutaneous tissue, unspecified    Dizziness and giddiness    Essential (primary) hypertension    Gout    Hypercholesterolemia    Hyperlipidemia, unspecified    Hypertension    Kidney stones    Low back pain    Other chest pain    Prostate enlargement    Past Surgical History:  Procedure Laterality Date   COLONOSCOPY N/A 10/23/2015   Procedure: COLONOSCOPY;  Surgeon: Corbin Ade, MD;  [facility];  Service: Endoscopy;  Laterality: N/A;  0915   COLONOSCOPY N/A 06/13/2019   Procedure: COLONOSCOPY;  Surgeon: Corbin Ade, MD;  [facility];  Service: Endoscopy;  Laterality: N/A;  7:30am   POLYPECTOMY  06/13/2019   Procedure: POLYPECTOMY;  Surgeon: Corbin Ade, MD;  Location: [facility];  Service: Endoscopy;;   TONSILECTOMY, ADENOIDECTOMY, BILATERAL MYRINGOTOMY AND TUBES      TRANSURETHRAL RESECTION OF PROSTATE     Social History   Tobacco Use   Smoking status: Some Days    Packs/day: 1    Types: Cigarettes   Smokeless tobacco: Never   Tobacco comments:    Started smoking at age 63, he smokes one pack a day. 07/17/2021. Pt is not ready to quit.   Vaping Use   Vaping Use: Never used  Substance Use Topics   Alcohol use: Not Currently    Comment: 1-2 beers a week   Drug use: No   Family History  Problem Relation Age of Onset   Heart disease Father    Colon polyps Sister        "a lot of colon polyps"   Colon cancer Brother    Prostate cancer Brother    Colon cancer Paternal Uncle    Allergies  Allergen Reactions   Penicillins Swelling    Did it involve swelling of the face/tongue/throat, SOB, or low BP? Yes Did it involve sudden or severe rash/hives, skin peeling, or any reaction on the inside of your mouth or nose? No Did you need to seek medical attention at a hospital or doctor's office? Yes When did it last happen? 15-20 years ago If all above answers are "NO", may proceed with cephalosporin use.    Review of Systems  Constitutional:  Positive for malaise/fatigue.  Musculoskeletal:  Positive  for back pain (Chronic lumbar back pain).  All other systems reviewed and are negative.    Objective:     BP 138/77   Pulse 68   Ht 6' (1.829 m)   Wt 185 lb (83.9 kg)   SpO2 95%   BMI 25.09 kg/m  BP Readings from Last 3 Encounters:  12/13/22 138/77  08/04/22 134/77  04/01/22 138/80   Physical Exam Vitals reviewed.  Constitutional:      General: He is not in acute distress.    Appearance: Normal appearance. He is not ill-appearing.  HENT:     Head: Normocephalic and atraumatic.     Right Ear: External ear normal.     Left Ear: External ear normal.     Nose: Nose normal. No congestion or rhinorrhea.     Mouth/Throat:     Mouth: Mucous membranes are moist.     Pharynx: Oropharynx is clear.  Eyes:     General: No scleral icterus.     Extraocular Movements: Extraocular movements intact.     Conjunctiva/sclera: Conjunctivae normal.     Pupils: Pupils are equal, round, and reactive to light.  Cardiovascular:     Rate and Rhythm: Normal rate and regular rhythm.     Pulses: Normal pulses.     Heart sounds: Normal heart sounds. No murmur heard. Pulmonary:     Effort: Pulmonary effort is normal.     Breath sounds: Normal breath sounds. No wheezing, rhonchi or rales.  Abdominal:     General: Abdomen is flat. Bowel sounds are normal. There is no distension.     Palpations: Abdomen is soft.     Tenderness: There is no abdominal tenderness.  Musculoskeletal:        General: No swelling or deformity. Normal range of motion.     Cervical back: Normal range of motion.  Skin:    General: Skin is warm and dry.     Capillary Refill: Capillary refill takes less than 2 seconds.  Neurological:     General: No focal deficit present.     Mental Status: He is alert and oriented to person, place, and time.     Motor: No weakness.  Psychiatric:        Mood and Affect: Mood normal.        Behavior: Behavior normal.        Thought Content: Thought content normal.   Last CBC Lab Results  Component Value Date   WBC 5.1 07/20/2021   HGB 14.9 07/20/2021   HCT 44.3 07/20/2021   MCV 94 07/20/2021   MCH 31.6 07/20/2021   RDW 11.8 07/20/2021   PLT 242 07/20/2021   Last metabolic panel Lab Results  Component Value Date   GLUCOSE 105 (H) 11/12/2021   NA 142 11/12/2021   K 4.6 11/12/2021   CL 103 11/12/2021   CO2 26 11/12/2021   BUN 17 11/12/2021   CREATININE 1.19 11/12/2021   EGFR 61 11/12/2021   CALCIUM 9.7 11/12/2021   PROT 6.9 11/12/2021   ALBUMIN 4.5 11/12/2021   LABGLOB 2.4 11/12/2021   AGRATIO 1.9 11/12/2021   BILITOT 0.4 11/12/2021   ALKPHOS 72 11/12/2021   AST 17 11/12/2021   ALT 22 11/12/2021   Last lipids Lab Results  Component Value Date   CHOL 138 01/08/2022   HDL 36 (L) 01/08/2022   LDLCALC 77  01/08/2022   TRIG 142 01/08/2022   CHOLHDL 3.8 01/08/2022   Last hemoglobin A1c Lab Results  Component  Value Date   HGBA1C 6.1 (H) 07/20/2021   Last thyroid functions Lab Results  Component Value Date   TSH 1.840 07/20/2021     Assessment & Plan:   Problem List Items Addressed This Visit       Essential (primary) hypertension - Primary    BP remains well-controlled on lisinopril-HCTZ.  No medication changes are indicated today.      Chronic obstructive pulmonary disease, unspecified (HCC)    Asymptomatic.  Pulmonary exam is unremarkable today.  Has an albuterol inhaler but has not needed to use it recently. -No medication changes today      Dermatitis of right foot    Resolved.  Using Temovate ointment twice daily as needed.      Hyperlipidemia, unspecified    Currently prescribed simvastatin 10 mg daily. -Repeat lipid panel ordered today      Low back pain    Chronic, intermittent issue.  Pain is worse with activity and relieved by rest.  No red flag symptoms identified.  His exam today is unremarkable. -Home PT exercises provided -Recommended as needed use of NSAIDs/Tylenol for pain relief      Prediabetes    Repeat A1c ordered today      Gout    Denies recent gout flares.  Symptoms are currently well-controlled with allopurinol.      Fatigue    His acute concern today is recent fatigue.  His daughter notes that he is napping more regularly.  Denies associated symptoms and remains active on his farm. -Repeat labs ordered today to assess for underlying metabolic etiology contributing to his symptoms       Return in about 6 months (around 06/14/2023).    Billie Lade, MD

## 2022-12-13 NOTE — Assessment & Plan Note (Signed)
Repeat A1c ordered today 

## 2022-12-13 NOTE — Patient Instructions (Signed)
It was a pleasure to see you today.  Thank you for giving [place] the opportunity to be involved in your care.  Below is a brief recap of your visit and next steps.  We will plan to see you again in 6 months.  Summary No medication changes today Repeat labs ordered Please see the attached exercises for your back Follow up in 6 months

## 2022-12-14 LAB — LIPID PANEL
Chol/HDL Ratio: 4.6 ratio (ref 0.0–5.0)
Cholesterol, Total: 146 mg/dL (ref 100–199)
HDL: 32 mg/dL — ABNORMAL LOW (ref >39)
LDL Chol Calc (NIH): 80 mg/dL (ref 0–99)
Triglycerides: 203 mg/dL — ABNORMAL HIGH (ref 0–149)
VLDL Cholesterol Cal: 34 mg/dL (ref 5–40)

## 2022-12-14 LAB — IRON,TIBC AND FERRITIN PANEL
Ferritin: 261 ng/mL (ref 30–400)
Iron Saturation: 30 % (ref 15–55)
Iron: 102 ug/dL (ref 38–169)
Total Iron Binding Capacity: 345 ug/dL (ref 250–450)
UIBC: 243 ug/dL (ref 111–343)

## 2022-12-14 LAB — CBC WITH DIFFERENTIAL/PLATELET
Basophils Absolute: 0 10*3/uL (ref 0.0–0.2)
Basos: 0 %
EOS (ABSOLUTE): 0.1 10*3/uL (ref 0.0–0.4)
Eos: 2 %
Hematocrit: 44.4 % (ref 37.5–51.0)
Hemoglobin: 14.8 g/dL (ref 13.0–17.7)
Immature Grans (Abs): 0 10*3/uL (ref 0.0–0.1)
Immature Granulocytes: 0 %
Lymphocytes Absolute: 1.4 10*3/uL (ref 0.7–3.1)
Lymphs: 22 %
MCH: 31.4 pg (ref 26.6–33.0)
MCHC: 33.3 g/dL (ref 31.5–35.7)
MCV: 94 fL (ref 79–97)
Monocytes Absolute: 0.5 10*3/uL (ref 0.1–0.9)
Monocytes: 7 %
Neutrophils Absolute: 4.2 10*3/uL (ref 1.4–7.0)
Neutrophils: 69 %
Platelets: 195 10*3/uL (ref 150–450)
RBC: 4.71 x10E6/uL (ref 4.14–5.80)
RDW: 12.7 % (ref 11.6–15.4)
WBC: 6.1 10*3/uL (ref 3.4–10.8)

## 2022-12-14 LAB — CMP14+EGFR
ALT: 25 IU/L (ref 0–44)
AST: 20 IU/L (ref 0–40)
Albumin/Globulin Ratio: 1.7 (ref 1.2–2.2)
Albumin: 4.3 g/dL (ref 3.7–4.7)
Alkaline Phosphatase: 91 IU/L (ref 44–121)
BUN/Creatinine Ratio: 21 (ref 10–24)
BUN: 25 mg/dL (ref 8–27)
Bilirubin Total: 0.4 mg/dL (ref 0.0–1.2)
CO2: 24 mmol/L (ref 20–29)
Calcium: 9.6 mg/dL (ref 8.6–10.2)
Chloride: 100 mmol/L (ref 96–106)
Creatinine, Ser: 1.2 mg/dL (ref 0.76–1.27)
Globulin, Total: 2.6 g/dL (ref 1.5–4.5)
Glucose: 98 mg/dL (ref 70–99)
Potassium: 4.8 mmol/L (ref 3.5–5.2)
Sodium: 139 mmol/L (ref 134–144)
Total Protein: 6.9 g/dL (ref 6.0–8.5)
eGFR: 60 mL/min/{1.73_m2} (ref >59)

## 2022-12-14 LAB — HEMOGLOBIN A1C
Est. average glucose Bld gHb Est-mCnc: 131 mg/dL
Hgb A1c MFr Bld: 6.2 % — ABNORMAL HIGH (ref 4.8–5.6)

## 2022-12-14 LAB — TSH+FREE T4
Free T4: 1.27 ng/dL (ref 0.82–1.77)
TSH: 1.65 u[IU]/mL (ref 0.450–4.500)

## 2022-12-14 LAB — B12 AND FOLATE PANEL
Folate: 10.7 ng/mL (ref >3.0)
Vitamin B-12: 391 pg/mL (ref 232–1245)

## 2022-12-14 LAB — VITAMIN D 25 HYDROXY (VIT D DEFICIENCY, FRACTURES): Vit D, 25-Hydroxy: 25 ng/mL — ABNORMAL LOW (ref 30.0–100.0)

## 2023-02-01 ENCOUNTER — Other Ambulatory Visit: Payer: Self-pay | Admitting: Nurse Practitioner

## 2023-02-01 ENCOUNTER — Other Ambulatory Visit: Payer: Self-pay | Admitting: Internal Medicine

## 2023-02-01 DIAGNOSIS — I10 Essential (primary) hypertension: Secondary | ICD-10-CM

## 2023-02-01 DIAGNOSIS — E785 Hyperlipidemia, unspecified: Secondary | ICD-10-CM

## 2023-02-10 DIAGNOSIS — H353211 Exudative age-related macular degeneration, right eye, with active choroidal neovascularization: Secondary | ICD-10-CM | POA: Diagnosis not present

## 2023-03-15 ENCOUNTER — Ambulatory Visit: Payer: Medicare PPO | Admitting: Internal Medicine

## 2023-03-16 ENCOUNTER — Encounter: Payer: Self-pay | Admitting: Internal Medicine

## 2023-03-28 DIAGNOSIS — H353221 Exudative age-related macular degeneration, left eye, with active choroidal neovascularization: Secondary | ICD-10-CM | POA: Diagnosis not present

## 2023-04-13 DIAGNOSIS — H353211 Exudative age-related macular degeneration, right eye, with active choroidal neovascularization: Secondary | ICD-10-CM | POA: Diagnosis not present

## 2023-05-09 ENCOUNTER — Ambulatory Visit: Payer: Medicare PPO | Admitting: Internal Medicine

## 2023-05-23 DIAGNOSIS — H353221 Exudative age-related macular degeneration, left eye, with active choroidal neovascularization: Secondary | ICD-10-CM | POA: Diagnosis not present

## 2023-06-20 ENCOUNTER — Encounter: Payer: Self-pay | Admitting: Internal Medicine

## 2023-06-20 ENCOUNTER — Ambulatory Visit (HOSPITAL_COMMUNITY)
Admission: RE | Admit: 2023-06-20 | Discharge: 2023-06-20 | Disposition: A | Discharge status: Home / Self Care | Payer: Medicare PPO | Source: Ambulatory Visit | Attending: Internal Medicine | Admitting: Internal Medicine

## 2023-06-20 ENCOUNTER — Telehealth: Payer: Self-pay | Admitting: Internal Medicine

## 2023-06-20 ENCOUNTER — Ambulatory Visit (INDEPENDENT_AMBULATORY_CARE_PROVIDER_SITE_OTHER): Payer: Medicare PPO | Admitting: Internal Medicine

## 2023-06-20 VITALS — BP 130/70 | HR 63 | Ht 70.47 in | Wt 183.2 lb

## 2023-06-20 DIAGNOSIS — G8929 Other chronic pain: Secondary | ICD-10-CM | POA: Insufficient documentation

## 2023-06-20 DIAGNOSIS — I10 Essential (primary) hypertension: Secondary | ICD-10-CM

## 2023-06-20 DIAGNOSIS — M546 Pain in thoracic spine: Secondary | ICD-10-CM | POA: Insufficient documentation

## 2023-06-20 DIAGNOSIS — J449 Chronic obstructive pulmonary disease, unspecified: Secondary | ICD-10-CM | POA: Diagnosis not present

## 2023-06-20 DIAGNOSIS — E782 Mixed hyperlipidemia: Secondary | ICD-10-CM

## 2023-06-20 DIAGNOSIS — R7303 Prediabetes: Secondary | ICD-10-CM

## 2023-06-20 DIAGNOSIS — M47814 Spondylosis without myelopathy or radiculopathy, thoracic region: Secondary | ICD-10-CM | POA: Diagnosis not present

## 2023-06-20 NOTE — Assessment & Plan Note (Signed)
He endorses chronic thoracic back pain worse with activity and alleviated by rest.  He remains active, farming daily.  No additional symptoms identified.  Given persistence of symptoms, x-rays of the thoracic spine were ordered today.  We discussed a referral to formal physical therapy but he declines for now.  Further management pending imaging results.

## 2023-06-20 NOTE — Assessment & Plan Note (Signed)
Asymptomatic currently.  Pulmonary exam unremarkable.  Infrequently requires albuterol.

## 2023-06-20 NOTE — Assessment & Plan Note (Signed)
Lipid panel updated in June.  Total cholesterol 146 and LDL 80.  He remains on simvastatin 10 mg daily.

## 2023-06-20 NOTE — Progress Notes (Signed)
Established Patient Office Visit  Subjective   Patient ID: Samuel Pope, male    DOB: 1941-05-15  Age: 82 y.o. MRN: [id number]  Chief Complaint  Patient presents with   Follow-up    Concern- His back starts hurting everyday around noon 12:00   Samuel Pope returns to care today for routine follow-up.  He was last evaluated by me on 6/3.  At that time he endorsed fatigue and chronic back pain.  Repeat labs were ordered, home PT exercises were provided, and 75-month follow-up was arranged.  There have been no acute interval events.  Today Samuel Pope continues to endorse back pain, mostly located in the thoracic region.  He still works in farming and states that his back begins to hurt every day around noon.  Pain is alleviated with rest.  He denies any additional symptoms.  Past Medical History:  Diagnosis Date   Anesthesia of skin    Chronic obstructive pulmonary disease, unspecified (HCC)    Disorder of the skin and subcutaneous tissue, unspecified    Dizziness and giddiness    Essential (primary) hypertension    Gout    Hypercholesterolemia    Hyperlipidemia, unspecified    Hypertension    Kidney stones    Low back pain    Other chest pain    Prostate enlargement    Past Surgical History:  Procedure Laterality Date   COLONOSCOPY N/A 10/23/2015   Procedure: COLONOSCOPY;  Surgeon: Corbin Ade, MD;  [facility];  Service: Endoscopy;  Laterality: N/A;  0915   COLONOSCOPY N/A 06/13/2019   Procedure: COLONOSCOPY;  Surgeon: Corbin Ade, MD;  [facility];  Service: Endoscopy;  Laterality: N/A;  7:30am   POLYPECTOMY  06/13/2019   Procedure: POLYPECTOMY;  Surgeon: Corbin Ade, MD;  [facility];  Service: Endoscopy;;   TONSILECTOMY, ADENOIDECTOMY, BILATERAL MYRINGOTOMY AND TUBES     TRANSURETHRAL RESECTION OF PROSTATE     Social History   Tobacco Use   Smoking status: Some Days    Current packs/day: 1.00    Types: Cigarettes   Smokeless  tobacco: Never   Tobacco comments:    Started smoking at age 86, he smokes one pack a day. 07/17/2021. Pt is not ready to quit.   Vaping Use   Vaping status: Never Used  Substance Use Topics   Alcohol use: Not Currently    Comment: 1-2 beers a week   Drug use: No   Family History  Problem Relation Age of Onset   Heart disease Father    Colon polyps Sister        "a lot of colon polyps"   Colon cancer Brother    Prostate cancer Brother    Colon cancer Paternal Uncle    Allergies  Allergen Reactions   Penicillins Swelling    Did it involve swelling of the face/tongue/throat, SOB, or low BP? Yes Did it involve sudden or severe rash/hives, skin peeling, or any reaction on the inside of your mouth or nose? No Did you need to seek medical attention at a hospital or doctor's office? Yes When did it last happen? 15-20 years ago If all above answers are "NO", may proceed with cephalosporin use.    Review of Systems  Constitutional:  Negative for chills and fever.  HENT:  Negative for sore throat.   Respiratory:  Negative for cough and shortness of breath.   Cardiovascular:  Negative for chest pain, palpitations and leg  swelling.  Gastrointestinal:  Negative for abdominal pain, blood in stool, constipation, diarrhea, nausea and vomiting.  Genitourinary:  Negative for dysuria and hematuria.  Musculoskeletal:  Positive for back pain (Chronic thoracic back pain). Negative for myalgias.  Skin:  Negative for itching and rash.  Neurological:  Negative for dizziness and headaches.  Psychiatric/Behavioral:  Negative for depression and suicidal ideas.      Objective:     BP 130/70   Pulse 63   Ht 5' 10.47" (1.79 m)   Wt 183 lb 3.2 oz (83.1 kg)   SpO2 95%   BMI 25.94 kg/m  BP Readings from Last 3 Encounters:  06/20/23 130/70  12/13/22 138/77  08/04/22 134/77   Physical Exam Vitals reviewed.  Constitutional:      General: He is not in acute distress.    Appearance: Normal  appearance. He is not ill-appearing.  HENT:     Head: Normocephalic and atraumatic.     Right Ear: External ear normal.     Left Ear: External ear normal.     Nose: Nose normal. No congestion or rhinorrhea.     Mouth/Throat:     Mouth: Mucous membranes are moist.     Pharynx: Oropharynx is clear.  Eyes:     General: No scleral icterus.    Extraocular Movements: Extraocular movements intact.     Conjunctiva/sclera: Conjunctivae normal.     Pupils: Pupils are equal, round, and reactive to light.  Cardiovascular:     Rate and Rhythm: Normal rate and regular rhythm.     Pulses: Normal pulses.     Heart sounds: Normal heart sounds. No murmur heard. Pulmonary:     Effort: Pulmonary effort is normal.     Breath sounds: Normal breath sounds. No wheezing, rhonchi or rales.  Abdominal:     General: Abdomen is flat. Bowel sounds are normal. There is no distension.     Palpations: Abdomen is soft.     Tenderness: There is no abdominal tenderness.  Musculoskeletal:        General: Tenderness (TTP of the bilateral paraspinal muscles of the thoracic spine) present. No swelling or deformity. Normal range of motion.     Cervical back: Normal range of motion.  Skin:    General: Skin is warm and dry.     Capillary Refill: Capillary refill takes less than 2 seconds.  Neurological:     General: No focal deficit present.     Mental Status: He is alert and oriented to person, place, and time.     Motor: No weakness.  Psychiatric:        Mood and Affect: Mood normal.        Behavior: Behavior normal.        Thought Content: Thought content normal.   Last CBC Lab Results  Component Value Date   WBC 6.1 12/13/2022   HGB 14.8 12/13/2022   HCT 44.4 12/13/2022   MCV 94 12/13/2022   MCH 31.4 12/13/2022   RDW 12.7 12/13/2022   PLT 195 12/13/2022   Last metabolic panel Lab Results  Component Value Date   GLUCOSE 98 12/13/2022   NA 139 12/13/2022   K 4.8 12/13/2022   CL 100 12/13/2022   CO2  24 12/13/2022   BUN 25 12/13/2022   CREATININE 1.20 12/13/2022   EGFR 60 12/13/2022   CALCIUM 9.6 12/13/2022   PROT 6.9 12/13/2022   ALBUMIN 4.3 12/13/2022   LABGLOB 2.6 12/13/2022   AGRATIO 1.7 12/13/2022  BILITOT 0.4 12/13/2022   ALKPHOS 91 12/13/2022   AST 20 12/13/2022   ALT 25 12/13/2022   Last lipids Lab Results  Component Value Date   CHOL 146 12/13/2022   HDL 32 (L) 12/13/2022   LDLCALC 80 12/13/2022   TRIG 203 (H) 12/13/2022   CHOLHDL 4.6 12/13/2022   Last hemoglobin A1c Lab Results  Component Value Date   HGBA1C 6.2 (H) 12/13/2022   Last thyroid functions Lab Results  Component Value Date   TSH 1.650 12/13/2022   Last vitamin D Lab Results  Component Value Date   VD25OH 25.0 (L) 12/13/2022   Last vitamin B12 and Folate Lab Results  Component Value Date   VITAMINB12 391 12/13/2022   FOLATE 10.7 12/13/2022     Assessment & Plan:   Problem List Items Addressed This Visit       Essential (primary) hypertension    Remains adequately controlled on current antihypertensive regimen.  No medication changes are indicated today.      Chronic obstructive pulmonary disease, unspecified (HCC)    Asymptomatic currently.  Pulmonary exam unremarkable.  Infrequently requires albuterol.      Hyperlipidemia, unspecified    Lipid panel updated in June.  Total cholesterol 146 and LDL 80.  He remains on simvastatin 10 mg daily.      Prediabetes    A1c 6.2 on labs from June.  He remains focused on dietary efforts to control his blood sugar.      Chronic thoracic back pain - Primary    He endorses chronic thoracic back pain worse with activity and alleviated by rest.  He remains active, farming daily.  No additional symptoms identified.  Given persistence of symptoms, x-rays of the thoracic spine were ordered today.  We discussed a referral to formal physical therapy but he declines for now.  Further management pending imaging results.      Return in about 3  months (around 09/18/2023).   Billie Lade, MD

## 2023-06-20 NOTE — Assessment & Plan Note (Signed)
A1c 6.2 on labs from June.  He remains focused on dietary efforts to control his blood sugar.

## 2023-06-20 NOTE — Assessment & Plan Note (Signed)
 Remains adequately controlled on current antihypertensive regimen.  No medication changes are indicated today.

## 2023-06-20 NOTE — Telephone Encounter (Signed)
Handicap placard   Noted  Copied Sleeved  Original in PCP box Copy front desk folder  

## 2023-06-20 NOTE — Patient Instructions (Signed)
It was a pleasure to see you today.  Thank you for giving [place] the opportunity to be involved in your care.  Below is a brief recap of your visit and next steps.  We will plan to see you again in 3 months.  Summary No medication changes today Xrays of the thoracic spine ordered Follow up in 3 months

## 2023-06-21 NOTE — Telephone Encounter (Signed)
Called patient form ready, he will pick up in a day or two. In front completed folder at front desk

## 2023-06-27 DIAGNOSIS — H353211 Exudative age-related macular degeneration, right eye, with active choroidal neovascularization: Secondary | ICD-10-CM | POA: Diagnosis not present

## 2023-07-01 NOTE — Telephone Encounter (Signed)
Patient picked up.

## 2023-07-27 DIAGNOSIS — H353221 Exudative age-related macular degeneration, left eye, with active choroidal neovascularization: Secondary | ICD-10-CM | POA: Diagnosis not present

## 2023-08-15 ENCOUNTER — Other Ambulatory Visit: Payer: Self-pay | Admitting: Internal Medicine

## 2023-08-15 DIAGNOSIS — I10 Essential (primary) hypertension: Secondary | ICD-10-CM

## 2023-08-15 MED ORDER — LISINOPRIL-HYDROCHLOROTHIAZIDE 20-12.5 MG PO TABS
1.0000 | ORAL_TABLET | Freq: Every day | ORAL | 3 refills | Status: DC
Start: 2023-08-15 — End: 2024-04-09

## 2023-08-15 NOTE — Telephone Encounter (Signed)
Copied from CRM [id number]. Topic: Clinical - Medication Refill >> Aug 15, 2023  9:39 AM Gildardo Pounds wrote: Most Recent Primary Care Visit:  Provider: Christel Mormon E  Department: [facility]  Visit Type: OFFICE VISIT  Date: 06/20/2023  Medication: lisinopril-hydrochlorothiazide (ZESTORETIC) 20-12.5 MG tablet  Has the patient contacted their pharmacy? Yes (Agent: If no, request that the patient contact the pharmacy for the refill. If patient does not wish to contact the pharmacy document the reason why and proceed with request.) (Agent: If yes, when and what did the pharmacy advise?)  Is this the correct pharmacy for this prescription? Yes If no, delete pharmacy and type the correct one.  This is the patient's preferred pharmacy:  [facility] [phone] - [place], [place] - [address] AT [facility] [address] [place] [address] Phone: [phone] Fax: [phone]     Has the prescription been filled recently? No  Is the patient out of the medication? Yes  Has the patient been seen for an appointment in the last year OR does the patient have an upcoming appointment? Yes  Can we respond through MyChart? No  Agent: Please be advised that Rx refills may take up to 3 business days. We ask that you follow-up with your pharmacy.

## 2023-08-22 DIAGNOSIS — H353211 Exudative age-related macular degeneration, right eye, with active choroidal neovascularization: Secondary | ICD-10-CM | POA: Diagnosis not present

## 2023-09-19 ENCOUNTER — Ambulatory Visit (INDEPENDENT_AMBULATORY_CARE_PROVIDER_SITE_OTHER): Payer: Medicare PPO | Admitting: Internal Medicine

## 2023-09-19 ENCOUNTER — Encounter: Payer: Self-pay | Admitting: Internal Medicine

## 2023-09-19 VITALS — BP 136/76 | HR 66 | Ht 70.0 in | Wt 189.2 lb

## 2023-09-19 DIAGNOSIS — G8929 Other chronic pain: Secondary | ICD-10-CM | POA: Diagnosis not present

## 2023-09-19 DIAGNOSIS — E782 Mixed hyperlipidemia: Secondary | ICD-10-CM

## 2023-09-19 DIAGNOSIS — M546 Pain in thoracic spine: Secondary | ICD-10-CM

## 2023-09-19 DIAGNOSIS — I10 Essential (primary) hypertension: Secondary | ICD-10-CM | POA: Diagnosis not present

## 2023-09-19 DIAGNOSIS — J449 Chronic obstructive pulmonary disease, unspecified: Secondary | ICD-10-CM | POA: Diagnosis not present

## 2023-09-19 DIAGNOSIS — D539 Nutritional anemia, unspecified: Secondary | ICD-10-CM | POA: Diagnosis not present

## 2023-09-19 DIAGNOSIS — R7303 Prediabetes: Secondary | ICD-10-CM

## 2023-09-19 NOTE — Progress Notes (Signed)
 Established Patient Office Visit  Subjective   Patient ID: Samuel Pope, male    DOB: 1941-04-08  Age: 83 y.o. MRN: [id number]  Chief Complaint  Patient presents with   Care Management    Three month follow up   Samuel Pope returns to care today for routine follow-up.  He was last evaluated by me in December 2024.  He endorsed chronic thoracic back pain at that time and declined referral to formal physical therapy.  X-rays of the thoracic spine were ordered.  No medication changes were made at that time and 47-month follow-up was arranged.  There have been no acute interval events.  Samuel Pope reports feeling well today.  He continues to endorse thoracic back pain that is unchanged in severity.  He does not have any acute concerns to discuss.  Past Medical History:  Diagnosis Date   Anesthesia of skin    Chronic obstructive pulmonary disease, unspecified (HCC)    Disorder of the skin and subcutaneous tissue, unspecified    Dizziness and giddiness    Essential (primary) hypertension    Gout    Hypercholesterolemia    Hyperlipidemia, unspecified    Hypertension    Kidney stones    Low back pain    Other chest pain    Prostate enlargement    Past Surgical History:  Procedure Laterality Date   COLONOSCOPY N/A 10/23/2015   Procedure: COLONOSCOPY;  Surgeon: Corbin Ade, MD;  [facility]: [facility];  Service: Endoscopy;  Laterality: N/A;  0915   COLONOSCOPY N/A 06/13/2019   Procedure: COLONOSCOPY;  Surgeon: Corbin Ade, MD;  Location: [facility];  Service: Endoscopy;  Laterality: N/A;  7:30am   POLYPECTOMY  06/13/2019   Procedure: POLYPECTOMY;  Surgeon: Corbin Ade, MD;  Location: [facility];  Service: Endoscopy;;   TONSILECTOMY, ADENOIDECTOMY, BILATERAL MYRINGOTOMY AND TUBES     TRANSURETHRAL RESECTION OF PROSTATE     Social History   Tobacco Use   Smoking status: Some Days    Current packs/day: 1.00    Types: Cigarettes   Smokeless tobacco: Never   Tobacco  comments:    Started smoking at age 80, he smokes one pack a day. 07/17/2021. Pt is not ready to quit.   Vaping Use   Vaping status: Never Used  Substance Use Topics   Alcohol use: Not Currently    Comment: 1-2 beers a week   Drug use: No   Family History  Problem Relation Age of Onset   Heart disease Father    Colon polyps Sister        "a lot of colon polyps"   Colon cancer Brother    Prostate cancer Brother    Colon cancer Paternal Uncle    Allergies  Allergen Reactions   Penicillins Swelling    Did it involve swelling of the face/tongue/throat, SOB, or low BP? Yes Did it involve sudden or severe rash/hives, skin peeling, or any reaction on the inside of your mouth or nose? No Did you need to seek medical attention at a hospital or doctor's office? Yes When did it last happen? 15-20 years ago If all above answers are "NO", may proceed with cephalosporin use.    Review of Systems  Musculoskeletal:  Positive for back pain (Chronic thoracic back pain).  All other systems reviewed and are negative.    Objective:     BP 136/76 (BP Location: Left Arm, Patient Position: Sitting, Cuff Size: Normal)   Pulse  66   Ht 5\' 10"  (1.778 m)   Wt 189 lb 3.2 oz (85.8 kg)   SpO2 96%   BMI 27.15 kg/m  BP Readings from Last 3 Encounters:  09/19/23 136/76  06/20/23 130/70  12/13/22 138/77   Physical Exam Vitals reviewed.  Constitutional:      General: He is not in acute distress.    Appearance: Normal appearance. He is not ill-appearing.  HENT:     Head: Normocephalic and atraumatic.     Right Ear: External ear normal.     Left Ear: External ear normal.     Nose: Nose normal. No congestion or rhinorrhea.     Mouth/Throat:     Mouth: Mucous membranes are moist.     Pharynx: Oropharynx is clear.  Eyes:     General: No scleral icterus.    Extraocular Movements: Extraocular movements intact.     Conjunctiva/sclera: Conjunctivae normal.     Pupils: Pupils are equal, round, and  reactive to light.  Cardiovascular:     Rate and Rhythm: Normal rate and regular rhythm.     Pulses: Normal pulses.     Heart sounds: Normal heart sounds. No murmur heard. Pulmonary:     Effort: Pulmonary effort is normal.     Breath sounds: Normal breath sounds. No wheezing, rhonchi or rales.  Abdominal:     General: Abdomen is flat. Bowel sounds are normal. There is no distension.     Palpations: Abdomen is soft.     Tenderness: There is no abdominal tenderness.  Musculoskeletal:        General: Tenderness (TTP of the bilateral paraspinal muscles of the thoracic spine) present. No swelling or deformity. Normal range of motion.     Cervical back: Normal range of motion.  Skin:    General: Skin is warm and dry.     Capillary Refill: Capillary refill takes less than 2 seconds.  Neurological:     General: No focal deficit present.     Mental Status: He is alert and oriented to person, place, and time.     Motor: No weakness.  Psychiatric:        Mood and Affect: Mood normal.        Behavior: Behavior normal.        Thought Content: Thought content normal.   Last CBC Lab Results  Component Value Date   WBC 6.1 12/13/2022   HGB 14.8 12/13/2022   HCT 44.4 12/13/2022   MCV 94 12/13/2022   MCH 31.4 12/13/2022   RDW 12.7 12/13/2022   PLT 195 12/13/2022   Last metabolic panel Lab Results  Component Value Date   GLUCOSE 98 12/13/2022   NA 139 12/13/2022   K 4.8 12/13/2022   CL 100 12/13/2022   CO2 24 12/13/2022   BUN 25 12/13/2022   CREATININE 1.20 12/13/2022   EGFR 60 12/13/2022   CALCIUM 9.6 12/13/2022   PROT 6.9 12/13/2022   ALBUMIN 4.3 12/13/2022   LABGLOB 2.6 12/13/2022   AGRATIO 1.7 12/13/2022   BILITOT 0.4 12/13/2022   ALKPHOS 91 12/13/2022   AST 20 12/13/2022   ALT 25 12/13/2022   Last lipids Lab Results  Component Value Date   CHOL 146 12/13/2022   HDL 32 (L) 12/13/2022   LDLCALC 80 12/13/2022   TRIG 203 (H) 12/13/2022   CHOLHDL 4.6 12/13/2022   Last  hemoglobin A1c Lab Results  Component Value Date   HGBA1C 6.2 (H) 12/13/2022   Last thyroid functions  Lab Results  Component Value Date   TSH 1.650 12/13/2022   Last vitamin D Lab Results  Component Value Date   VD25OH 25.0 (L) 12/13/2022   Last vitamin B12 and Folate Lab Results  Component Value Date   VITAMINB12 391 12/13/2022   FOLATE 10.7 12/13/2022     Assessment & Plan:   Problem List Items Addressed This Visit       Essential (primary) hypertension - Primary   Remains adequately controlled with lisinopril-HCTZ.  No medication changes are indicated today.      Chronic obstructive pulmonary disease, unspecified (HCC)   Asymptomatic currently.  Pulmonary exam unremarkable.  Continues to use albuterol as needed.      Hyperlipidemia, unspecified   Currently prescribed simvastatin 10 mg daily.  Repeat lipid panel ordered today.      Prediabetes   A1c 6.2 on labs from June 2024.  Repeat A1c ordered today.      Chronic thoracic back pain   X-rays of the thoracic spine from December 2024 showed mild degenerative changes without acute fracture or findings.  He continues to endorse thoracic back pain that is unchanged in severity or quality.  Treatment options reviewed.  He is agreeable to attempting home PT exercises.       Return in about 6 months (around 03/21/2024).    Billie Lade, MD

## 2023-09-19 NOTE — Assessment & Plan Note (Signed)
 A1c 6.2 on labs from June 2024.  Repeat A1c ordered today.

## 2023-09-19 NOTE — Assessment & Plan Note (Signed)
Currently prescribed simvastatin 10 mg daily. -Repeat lipid panel ordered today 

## 2023-09-19 NOTE — Assessment & Plan Note (Signed)
 Remains adequately controlled with lisinopril-HCTZ.  No medication changes are indicated today.

## 2023-09-19 NOTE — Assessment & Plan Note (Signed)
 Asymptomatic currently.  Pulmonary exam unremarkable.  Continues to use albuterol as needed.

## 2023-09-19 NOTE — Assessment & Plan Note (Signed)
 X-rays of the thoracic spine from December 2024 showed mild degenerative changes without acute fracture or findings.  He continues to endorse thoracic back pain that is unchanged in severity or quality.  Treatment options reviewed.  He is agreeable to attempting home PT exercises.

## 2023-09-19 NOTE — Patient Instructions (Signed)
 It was a pleasure to see you today.  Thank you for giving [place] the opportunity to be involved in your care.  Below is a brief recap of your visit and next steps.  We will plan to see you again in 6 months.  Summary No medication changes today Please see the attached exercises for your back Repeat labs Follow up in 6 months

## 2023-09-21 LAB — CMP14+EGFR
ALT: 25 IU/L (ref 0–44)
AST: 25 IU/L (ref 0–40)
Albumin: 4.6 g/dL (ref 3.7–4.7)
Alkaline Phosphatase: 97 IU/L (ref 44–121)
BUN/Creatinine Ratio: 15 (ref 10–24)
BUN: 21 mg/dL (ref 8–27)
Bilirubin Total: 0.3 mg/dL (ref 0.0–1.2)
CO2: 25 mmol/L (ref 20–29)
Calcium: 9.3 mg/dL (ref 8.6–10.2)
Chloride: 100 mmol/L (ref 96–106)
Creatinine, Ser: 1.42 mg/dL — ABNORMAL HIGH (ref 0.76–1.27)
Globulin, Total: 2.3 g/dL (ref 1.5–4.5)
Glucose: 94 mg/dL (ref 70–99)
Potassium: 4.7 mmol/L (ref 3.5–5.2)
Sodium: 139 mmol/L (ref 134–144)
Total Protein: 6.9 g/dL (ref 6.0–8.5)
eGFR: 49 mL/min/{1.73_m2} — ABNORMAL LOW (ref >59)

## 2023-09-21 LAB — HEMOGLOBIN A1C
Est. average glucose Bld gHb Est-mCnc: 134 mg/dL
Hgb A1c MFr Bld: 6.3 % — ABNORMAL HIGH (ref 4.8–5.6)

## 2023-09-21 LAB — LIPID PANEL
Chol/HDL Ratio: 3.9 ratio (ref 0.0–5.0)
Cholesterol, Total: 141 mg/dL (ref 100–199)
HDL: 36 mg/dL — ABNORMAL LOW (ref >39)
LDL Chol Calc (NIH): 71 mg/dL (ref 0–99)
Triglycerides: 201 mg/dL — ABNORMAL HIGH (ref 0–149)
VLDL Cholesterol Cal: 34 mg/dL (ref 5–40)

## 2023-09-21 LAB — CBC WITH DIFFERENTIAL/PLATELET
Basophils Absolute: 0.1 10*3/uL (ref 0.0–0.2)
Basos: 1 %
EOS (ABSOLUTE): 0.1 10*3/uL (ref 0.0–0.4)
Eos: 2 %
Hematocrit: 45.4 % (ref 37.5–51.0)
Hemoglobin: 15.5 g/dL (ref 13.0–17.7)
Immature Grans (Abs): 0 10*3/uL (ref 0.0–0.1)
Immature Granulocytes: 0 %
Lymphocytes Absolute: 1.6 10*3/uL (ref 0.7–3.1)
Lymphs: 23 %
MCH: 31.9 pg (ref 26.6–33.0)
MCHC: 34.1 g/dL (ref 31.5–35.7)
MCV: 93 fL (ref 79–97)
Monocytes Absolute: 0.5 10*3/uL (ref 0.1–0.9)
Monocytes: 8 %
Neutrophils Absolute: 4.5 10*3/uL (ref 1.4–7.0)
Neutrophils: 66 %
Platelets: 199 10*3/uL (ref 150–450)
RBC: 4.86 x10E6/uL (ref 4.14–5.80)
RDW: 12.1 % (ref 11.6–15.4)
WBC: 6.7 10*3/uL (ref 3.4–10.8)

## 2023-09-21 LAB — B12 AND FOLATE PANEL
Folate: 7 ng/mL (ref >3.0)
Vitamin B-12: 342 pg/mL (ref 232–1245)

## 2023-09-21 LAB — TSH+FREE T4
Free T4: 1.3 ng/dL (ref 0.82–1.77)
TSH: 2.17 u[IU]/mL (ref 0.450–4.500)

## 2023-09-21 LAB — VITAMIN D 25 HYDROXY (VIT D DEFICIENCY, FRACTURES): Vit D, 25-Hydroxy: 29.1 ng/mL — ABNORMAL LOW (ref 30.0–100.0)

## 2023-09-23 DIAGNOSIS — R030 Elevated blood-pressure reading, without diagnosis of hypertension: Secondary | ICD-10-CM | POA: Diagnosis not present

## 2023-09-23 DIAGNOSIS — E663 Overweight: Secondary | ICD-10-CM | POA: Diagnosis not present

## 2023-09-23 DIAGNOSIS — L0390 Cellulitis, unspecified: Secondary | ICD-10-CM | POA: Diagnosis not present

## 2023-09-23 DIAGNOSIS — M79671 Pain in right foot: Secondary | ICD-10-CM | POA: Diagnosis not present

## 2023-09-23 DIAGNOSIS — Z6827 Body mass index (BMI) 27.0-27.9, adult: Secondary | ICD-10-CM | POA: Diagnosis not present

## 2023-09-26 ENCOUNTER — Encounter: Payer: Self-pay | Admitting: Family Medicine

## 2023-09-26 ENCOUNTER — Ambulatory Visit: Admitting: Family Medicine

## 2023-09-26 VITALS — BP 124/68 | HR 87 | Ht 70.0 in | Wt 188.1 lb

## 2023-09-26 DIAGNOSIS — M79661 Pain in right lower leg: Secondary | ICD-10-CM

## 2023-09-26 NOTE — Patient Instructions (Addendum)
 I appreciate the opportunity to provide care to you today!   Pain in Right Lower Leg  Complete the full course of the prescribed antibiotic. An ultrasound has been ordered to rule out deep vein thrombosis (DVT). I recommend daily elevation of the legs for 20-30 minutes above the level of the heart. Daily use of compression stockings is advised to improve circulation and reduce swelling. Regular exercise and weight reduction, if applicable, are encouraged to promote vascular health. Avoid prolonged sitting or standing to help alleviate symptoms and prevent further complications.       Please continue to a heart-healthy diet and increase your physical activities. Try to exercise for at least five days a week.    It was a pleasure to see you and I look forward to continuing to work together on your health and well-being. Please do not hesitate to call the office if you need care or have questions about your care.  In case of emergency, please visit the Emergency Department for urgent care, or contact our clinic at [phone] to schedule an appointment. We're here to help you!   Have a wonderful day and week. With Gratitude, Gilmore Laroche MSN, FNP-BC

## 2023-09-26 NOTE — Progress Notes (Unsigned)
 Acute Office Visit  Subjective:    Patient ID: Samuel Pope, male    DOB: May 17, 1941, 83 y.o.   MRN: [id number]  Chief Complaint  Patient presents with   Acute Visit    Foot injury 03.14.25  swelling in rt foot toes and ankle; causing pain . x ray taken at [facility] does not suggest any fracture. UC Doctor suggest it may be a blood clot. Would like to request order for [place]     HPI Patient is seen today for complaints reported in the chief complaint. The patient notes that the pain has subsided. He was prescribed doxycycline for prophylactic treatment of cellulitis of the affected foot. He denies any injury or trauma prior to the onset of redness and swelling in his right foot.  Past Medical History:  Diagnosis Date   Anesthesia of skin    Chronic obstructive pulmonary disease, unspecified (HCC)    Disorder of the skin and subcutaneous tissue, unspecified    Dizziness and giddiness    Essential (primary) hypertension    Gout    Hypercholesterolemia    Hyperlipidemia, unspecified    Hypertension    Kidney stones    Low back pain    Other chest pain    Prostate enlargement     Past Surgical History:  Procedure Laterality Date   COLONOSCOPY N/A 10/23/2015   Procedure: COLONOSCOPY;  Surgeon: Corbin Ade, MD;  [facility];  Service: Endoscopy;  Laterality: N/A;  0915   COLONOSCOPY N/A 06/13/2019   Procedure: COLONOSCOPY;  Surgeon: Corbin Ade, MD;  [facility];  Service: Endoscopy;  Laterality: N/A;  7:30am   POLYPECTOMY  06/13/2019   Procedure: POLYPECTOMY;  Surgeon: Corbin Ade, MD;  [facility];  Service: Endoscopy;;   TONSILECTOMY, ADENOIDECTOMY, BILATERAL MYRINGOTOMY AND TUBES     TRANSURETHRAL RESECTION OF PROSTATE      Family History  Problem Relation Age of Onset   Heart disease Father    Colon polyps Sister        "a lot of colon polyps"   Colon cancer Brother    Prostate cancer Brother    Colon cancer Paternal Uncle      Social History   Socioeconomic History   Marital status: Widowed    Spouse name: Not on file   Number of children: Not on file   Years of education: Not on file   Highest education level: Not on file  Occupational History   Occupation: retired  Tobacco Use   Smoking status: Some Days    Current packs/day: 1.00    Types: Cigarettes   Smokeless tobacco: Never   Tobacco comments:    Started smoking at age 63, he smokes one pack a day. 07/17/2021. Pt is not ready to quit.   Vaping Use   Vaping status: Never Used  Substance and Sexual Activity   Alcohol use: Not Currently    Comment: 1-2 beers a week   Drug use: No   Sexual activity: Not Currently  Other Topics Concern   Not on file  Social History Narrative   Was married for 56 years.Wife died in 06-29-20- Dementia.Retired.   Lives home alone now.    Social Drivers of Health   Financial Resource Strain: Low Risk  (08/04/2022)   Overall Financial Resource Strain (CARDIA)    Difficulty of Paying Living Expenses: Not hard at all  Food Insecurity: No Food Insecurity (08/04/2022)   Hunger Vital  Sign    Worried About Programme researcher, broadcasting/film/video in the Last Year: Never true    Ran Out of Food in the Last Year: Never true  Transportation Needs: No Transportation Needs (08/04/2022)   PRAPARE - Administrator, Civil Service (Medical): No    Lack of Transportation (Non-Medical): No  Physical Activity: Sufficiently Active (08/04/2022)   Exercise Vital Sign    Days of Exercise per Week: 5 days    Minutes of Exercise per Session: 30 min  Stress: No Stress Concern Present (08/04/2022)   [employer] of Occupational Health - Occupational Stress Questionnaire    Feeling of Stress : Not at all  Social Connections: Moderately Integrated (08/04/2022)   Social Connection and Isolation Panel [NHANES]    Frequency of Communication with Friends and Family: More than three times a week    Frequency of Social Gatherings with Friends and  Family: More than three times a week    Attends Religious Services: More than 4 times per year    Active Member of [employer] or Organizations: Yes    Attends Banker Meetings: More than 4 times per year    Marital Status: Widowed  Intimate Partner Violence: Not At Risk (08/04/2022)   Humiliation, Afraid, Rape, and Kick questionnaire    Fear of Current or Ex-Partner: No    Emotionally Abused: No    Physically Abused: No    Sexually Abused: No    Outpatient Medications Prior to Visit  Medication Sig Dispense Refill   acetaminophen (TYLENOL) 500 MG tablet Take 500 mg by mouth every 6 (six) hours as needed.     allopurinol (ZYLOPRIM) 100 MG tablet Take 1 tablet (100 mg total) by mouth daily. 30 tablet 2   doxycycline (VIBRAMYCIN) 100 MG capsule Take 100 mg by mouth 2 (two) times daily.     latanoprost (XALATAN) 0.005 % ophthalmic solution INSTILL 1 DROP INTO BOTH EYES AT BEDTIME 7.5 mL 3   lisinopril-hydrochlorothiazide (ZESTORETIC) 20-12.5 MG tablet Take 1 tablet by mouth daily. 90 tablet 3   simvastatin (ZOCOR) 10 MG tablet TAKE 1 TABLET (10 MG TOTAL) BY MOUTH AT BEDTIME. 90 tablet 3   No facility-administered medications prior to visit.    Allergies  Allergen Reactions   Penicillins Swelling    Did it involve swelling of the face/tongue/throat, SOB, or low BP? Yes Did it involve sudden or severe rash/hives, skin peeling, or any reaction on the inside of your mouth or nose? No Did you need to seek medical attention at a hospital or doctor's office? Yes When did it last happen? 15-20 years ago If all above answers are "NO", may proceed with cephalosporin use.     Review of Systems  Constitutional:  Negative for fatigue and fever.  Eyes:  Negative for visual disturbance.  Respiratory:  Negative for chest tightness and shortness of breath.   Cardiovascular:  Negative for chest pain and palpitations.  Musculoskeletal:        Right foot pain and swelling  Neurological:   Negative for dizziness and headaches.       Objective:    Physical Exam HENT:     Head: Normocephalic.     Right Ear: External ear normal.     Left Ear: External ear normal.     Nose: No congestion or rhinorrhea.     Mouth/Throat:     Mouth: Mucous membranes are moist.  Cardiovascular:     Rate and Rhythm: Regular rhythm.  Heart sounds: No murmur heard. Pulmonary:     Effort: No respiratory distress.     Breath sounds: Normal breath sounds.  Musculoskeletal:     Right foot: Normal range of motion. Swelling (mild) present. No deformity or tenderness.  Neurological:     Mental Status: He is alert.     BP 124/68   Pulse 87   Ht 5\' 10"  (1.778 m)   Wt 188 lb 1.3 oz (85.3 kg)   SpO2 93%   BMI 26.99 kg/m  Wt Readings from Last 3 Encounters:  09/26/23 188 lb 1.3 oz (85.3 kg)  09/19/23 189 lb 3.2 oz (85.8 kg)  06/20/23 183 lb 3.2 oz (83.1 kg)       Assessment & Plan:  Pain in right lower leg Assessment & Plan: Encouraged to continue prophylactic treatment of cellulitis with doxycycline. Swelling is mild with no pain or warmth to palpation. A vascular ultrasound will be obtained to rule out DVT. I recommend daily elevation of the legs for 20-30 minutes above the level of the heart. Daily use of compression stockings is advised to improve circulation and reduce swelling. Regular exercise and weight reduction, if applicable, are encouraged to promote vascular health. Avoid prolonged sitting or standing to help alleviate symptoms and prevent further complications.  Orders: -     VAS [place] LOWER EXTREMITY VENOUS (DVT)   Note: This chart has been completed using Engineer, civil (consulting) software, and while attempts have been made to ensure accuracy, certain words and phrases may not be transcribed as intended.   Gilmore Laroche, FNP

## 2023-09-29 DIAGNOSIS — M79661 Pain in right lower leg: Secondary | ICD-10-CM | POA: Insufficient documentation

## 2023-09-29 NOTE — Assessment & Plan Note (Signed)
 Encouraged to continue prophylactic treatment of cellulitis with doxycycline. Swelling is mild with no pain or warmth to palpation. A vascular ultrasound will be obtained to rule out DVT. I recommend daily elevation of the legs for 20-30 minutes above the level of the heart. Daily use of compression stockings is advised to improve circulation and reduce swelling. Regular exercise and weight reduction, if applicable, are encouraged to promote vascular health. Avoid prolonged sitting or standing to help alleviate symptoms and prevent further complications.

## 2023-09-30 ENCOUNTER — Telehealth: Payer: Self-pay | Admitting: Internal Medicine

## 2023-09-30 NOTE — Telephone Encounter (Signed)
 Collette advised

## 2023-09-30 NOTE — Telephone Encounter (Signed)
 Just the dvr is acceptable

## 2023-09-30 NOTE — Telephone Encounter (Signed)
 Copied from CRM [id number]. Topic: General - Other >> Sep 30, 2023  9:14 AM [place] B wrote: Reason for CRM: Collette from central scheduling called in abour order for vasc ultrasound. She States this isnt done at [facility] and she is asking if Dr Durwin Nora wants to have just a DVR of the lower left extremity. Please cb or says can chat her in teams

## 2023-10-04 ENCOUNTER — Ambulatory Visit (HOSPITAL_COMMUNITY)
Admission: RE | Admit: 2023-10-04 | Discharge: 2023-10-04 | Disposition: A | Discharge status: Home / Self Care | Source: Ambulatory Visit | Attending: Family Medicine | Admitting: Family Medicine

## 2023-10-04 DIAGNOSIS — M79661 Pain in right lower leg: Secondary | ICD-10-CM | POA: Diagnosis not present

## 2023-10-04 DIAGNOSIS — M79604 Pain in right leg: Secondary | ICD-10-CM | POA: Diagnosis not present

## 2023-10-05 DIAGNOSIS — H353221 Exudative age-related macular degeneration, left eye, with active choroidal neovascularization: Secondary | ICD-10-CM | POA: Diagnosis not present

## 2023-10-08 NOTE — Progress Notes (Signed)
 Hello Samuel Pope Your imaging was negative for DVT

## 2023-10-18 DIAGNOSIS — H353211 Exudative age-related macular degeneration, right eye, with active choroidal neovascularization: Secondary | ICD-10-CM | POA: Diagnosis not present

## 2023-11-22 ENCOUNTER — Other Ambulatory Visit: Payer: Self-pay | Admitting: Internal Medicine

## 2023-11-22 DIAGNOSIS — H402230 Chronic angle-closure glaucoma, bilateral, stage unspecified: Secondary | ICD-10-CM

## 2023-12-01 DIAGNOSIS — H353231 Exudative age-related macular degeneration, bilateral, with active choroidal neovascularization: Secondary | ICD-10-CM | POA: Diagnosis not present

## 2023-12-13 DIAGNOSIS — H353211 Exudative age-related macular degeneration, right eye, with active choroidal neovascularization: Secondary | ICD-10-CM | POA: Diagnosis not present

## 2024-01-12 ENCOUNTER — Other Ambulatory Visit: Payer: Self-pay | Admitting: Internal Medicine

## 2024-01-12 DIAGNOSIS — E785 Hyperlipidemia, unspecified: Secondary | ICD-10-CM

## 2024-01-23 ENCOUNTER — Other Ambulatory Visit: Payer: Self-pay | Admitting: Internal Medicine

## 2024-01-23 DIAGNOSIS — H353221 Exudative age-related macular degeneration, left eye, with active choroidal neovascularization: Secondary | ICD-10-CM | POA: Diagnosis not present

## 2024-01-23 DIAGNOSIS — H402230 Chronic angle-closure glaucoma, bilateral, stage unspecified: Secondary | ICD-10-CM

## 2024-02-06 DIAGNOSIS — M7662 Achilles tendinitis, left leg: Secondary | ICD-10-CM | POA: Diagnosis not present

## 2024-02-14 DIAGNOSIS — H353211 Exudative age-related macular degeneration, right eye, with active choroidal neovascularization: Secondary | ICD-10-CM | POA: Diagnosis not present

## 2024-03-13 DIAGNOSIS — H353221 Exudative age-related macular degeneration, left eye, with active choroidal neovascularization: Secondary | ICD-10-CM | POA: Diagnosis not present

## 2024-03-19 DIAGNOSIS — M25561 Pain in right knee: Secondary | ICD-10-CM | POA: Diagnosis not present

## 2024-03-19 DIAGNOSIS — Z13220 Encounter for screening for lipoid disorders: Secondary | ICD-10-CM | POA: Diagnosis not present

## 2024-03-19 DIAGNOSIS — Z1321 Encounter for screening for nutritional disorder: Secondary | ICD-10-CM | POA: Diagnosis not present

## 2024-03-19 DIAGNOSIS — Z1329 Encounter for screening for other suspected endocrine disorder: Secondary | ICD-10-CM | POA: Diagnosis not present

## 2024-03-19 DIAGNOSIS — Z0001 Encounter for general adult medical examination with abnormal findings: Secondary | ICD-10-CM | POA: Diagnosis not present

## 2024-03-19 DIAGNOSIS — Z6825 Body mass index (BMI) 25.0-25.9, adult: Secondary | ICD-10-CM | POA: Diagnosis not present

## 2024-03-19 DIAGNOSIS — D559 Anemia due to enzyme disorder, unspecified: Secondary | ICD-10-CM | POA: Diagnosis not present

## 2024-04-07 ENCOUNTER — Other Ambulatory Visit: Payer: Self-pay | Admitting: Family Medicine

## 2024-04-07 ENCOUNTER — Other Ambulatory Visit: Payer: Self-pay | Admitting: Internal Medicine

## 2024-04-07 DIAGNOSIS — H402230 Chronic angle-closure glaucoma, bilateral, stage unspecified: Secondary | ICD-10-CM

## 2024-04-07 DIAGNOSIS — I10 Essential (primary) hypertension: Secondary | ICD-10-CM

## 2024-04-10 DIAGNOSIS — H353211 Exudative age-related macular degeneration, right eye, with active choroidal neovascularization: Secondary | ICD-10-CM | POA: Diagnosis not present

## 2024-05-03 DIAGNOSIS — H353221 Exudative age-related macular degeneration, left eye, with active choroidal neovascularization: Secondary | ICD-10-CM | POA: Diagnosis not present

## 2024-06-22 ENCOUNTER — Other Ambulatory Visit: Payer: Self-pay | Admitting: Family Medicine

## 2024-06-22 DIAGNOSIS — H402230 Chronic angle-closure glaucoma, bilateral, stage unspecified: Secondary | ICD-10-CM

## 2024-08-23 ENCOUNTER — Ambulatory Visit: Admitting: Internal Medicine
